# Patient Record
Sex: Male | Born: 1935 | Race: White | Hispanic: No | Marital: Married | Smoking: Former smoker
Health system: Southern US, Community
[De-identification: ages and names within clinical notes are randomized; demographics above are authoritative.]

## PROBLEM LIST (undated history)

## (undated) DIAGNOSIS — I482 Chronic atrial fibrillation, unspecified: Secondary | ICD-10-CM

## (undated) DIAGNOSIS — R7303 Prediabetes: Secondary | ICD-10-CM

## (undated) DIAGNOSIS — E7800 Pure hypercholesterolemia, unspecified: Secondary | ICD-10-CM

## (undated) DIAGNOSIS — H3530 Unspecified macular degeneration: Secondary | ICD-10-CM

## (undated) DIAGNOSIS — K279 Peptic ulcer, site unspecified, unspecified as acute or chronic, without hemorrhage or perforation: Secondary | ICD-10-CM

## (undated) DIAGNOSIS — Z9889 Other specified postprocedural states: Secondary | ICD-10-CM

## (undated) DIAGNOSIS — Z862 Personal history of diseases of the blood and blood-forming organs and certain disorders involving the immune mechanism: Secondary | ICD-10-CM

## (undated) DIAGNOSIS — E785 Hyperlipidemia, unspecified: Secondary | ICD-10-CM

## (undated) DIAGNOSIS — I340 Nonrheumatic mitral (valve) insufficiency: Secondary | ICD-10-CM

## (undated) DIAGNOSIS — Z9289 Personal history of other medical treatment: Secondary | ICD-10-CM

## (undated) HISTORY — DX: Nonrheumatic mitral (valve) insufficiency: I34.0

## (undated) HISTORY — DX: Hyperlipidemia, unspecified: E78.5

## (undated) HISTORY — DX: Personal history of other medical treatment: Z92.89

## (undated) HISTORY — DX: Personal history of diseases of the blood and blood-forming organs and certain disorders involving the immune mechanism: Z86.2

## (undated) HISTORY — PX: HEMORRHOID SURGERY: SHX153

## (undated) HISTORY — DX: Unspecified macular degeneration: H35.30

## (undated) HISTORY — DX: Chronic atrial fibrillation, unspecified: I48.20

## (undated) HISTORY — PX: COLONOSCOPY: SHX174

## (undated) HISTORY — DX: Prediabetes: R73.03

## (undated) HISTORY — DX: Peptic ulcer, site unspecified, unspecified as acute or chronic, without hemorrhage or perforation: K27.9

## (undated) HISTORY — DX: Other specified postprocedural states: Z98.890

## (undated) HISTORY — DX: Pure hypercholesterolemia, unspecified: E78.00

## (undated) HISTORY — PX: CARDIAC ELECTROPHYSIOLOGY STUDY AND ABLATION: SHX1294

---

## 1973-09-13 HISTORY — PX: STOMACH SURGERY: SHX791

## 1975-09-14 HISTORY — PX: LUNG SURGERY: SHX703

## 2007-06-07 ENCOUNTER — Encounter: Admission: RE | Admit: 2007-06-07 | Discharge: 2007-06-07 | Payer: Self-pay | Admitting: Internal Medicine

## 2007-09-18 ENCOUNTER — Encounter: Admission: RE | Admit: 2007-09-18 | Discharge: 2007-09-18 | Payer: Self-pay | Admitting: Internal Medicine

## 2008-08-12 ENCOUNTER — Ambulatory Visit (HOSPITAL_COMMUNITY): Admission: RE | Admit: 2008-08-12 | Discharge: 2008-08-13 | Payer: Self-pay | Admitting: General Surgery

## 2008-08-12 ENCOUNTER — Encounter (INDEPENDENT_AMBULATORY_CARE_PROVIDER_SITE_OTHER): Payer: Self-pay | Admitting: General Surgery

## 2008-12-11 ENCOUNTER — Encounter: Payer: Self-pay | Admitting: Internal Medicine

## 2008-12-25 ENCOUNTER — Encounter: Payer: Self-pay | Admitting: Internal Medicine

## 2009-02-28 ENCOUNTER — Ambulatory Visit (HOSPITAL_COMMUNITY): Admission: RE | Admit: 2009-02-28 | Discharge: 2009-02-28 | Payer: Self-pay | Admitting: Cardiology

## 2009-04-04 ENCOUNTER — Ambulatory Visit (HOSPITAL_COMMUNITY): Admission: RE | Admit: 2009-04-04 | Discharge: 2009-04-04 | Payer: Self-pay | Admitting: Cardiology

## 2009-06-18 ENCOUNTER — Encounter: Payer: Self-pay | Admitting: Internal Medicine

## 2009-07-14 ENCOUNTER — Encounter: Payer: Self-pay | Admitting: Internal Medicine

## 2009-07-21 ENCOUNTER — Ambulatory Visit: Payer: Self-pay | Admitting: Internal Medicine

## 2009-07-21 DIAGNOSIS — Z8679 Personal history of other diseases of the circulatory system: Secondary | ICD-10-CM

## 2009-07-21 DIAGNOSIS — I4892 Unspecified atrial flutter: Secondary | ICD-10-CM

## 2009-07-21 DIAGNOSIS — I517 Cardiomegaly: Secondary | ICD-10-CM

## 2009-07-21 DIAGNOSIS — E782 Mixed hyperlipidemia: Secondary | ICD-10-CM | POA: Insufficient documentation

## 2009-07-29 ENCOUNTER — Ambulatory Visit: Payer: Self-pay | Admitting: Internal Medicine

## 2009-07-29 ENCOUNTER — Ambulatory Visit (HOSPITAL_COMMUNITY): Admission: RE | Admit: 2009-07-29 | Discharge: 2009-07-30 | Payer: Self-pay | Admitting: Internal Medicine

## 2009-09-22 ENCOUNTER — Ambulatory Visit: Payer: Self-pay | Admitting: Internal Medicine

## 2009-10-16 ENCOUNTER — Encounter: Payer: Self-pay | Admitting: Internal Medicine

## 2009-12-03 ENCOUNTER — Telehealth: Payer: Self-pay | Admitting: Internal Medicine

## 2009-12-12 ENCOUNTER — Ambulatory Visit: Payer: Self-pay | Admitting: Internal Medicine

## 2009-12-12 DIAGNOSIS — R42 Dizziness and giddiness: Secondary | ICD-10-CM | POA: Insufficient documentation

## 2009-12-15 ENCOUNTER — Encounter: Payer: Self-pay | Admitting: Internal Medicine

## 2009-12-24 ENCOUNTER — Encounter (INDEPENDENT_AMBULATORY_CARE_PROVIDER_SITE_OTHER): Payer: Self-pay | Admitting: Internal Medicine

## 2009-12-24 ENCOUNTER — Ambulatory Visit (HOSPITAL_COMMUNITY): Admission: RE | Admit: 2009-12-24 | Discharge: 2009-12-24 | Payer: Self-pay | Admitting: Internal Medicine

## 2009-12-24 ENCOUNTER — Ambulatory Visit: Payer: Self-pay | Admitting: Vascular Surgery

## 2010-02-19 ENCOUNTER — Encounter: Payer: Self-pay | Admitting: Internal Medicine

## 2010-04-30 ENCOUNTER — Ambulatory Visit: Payer: Self-pay | Admitting: Cardiology

## 2010-04-30 ENCOUNTER — Encounter: Payer: Self-pay | Admitting: Internal Medicine

## 2010-06-04 ENCOUNTER — Ambulatory Visit: Payer: Self-pay | Admitting: Cardiology

## 2010-06-30 ENCOUNTER — Ambulatory Visit: Payer: Self-pay | Admitting: Cardiology

## 2010-07-31 ENCOUNTER — Ambulatory Visit: Payer: Self-pay | Admitting: Cardiology

## 2010-09-08 ENCOUNTER — Ambulatory Visit: Payer: Self-pay | Admitting: Cardiology

## 2010-10-02 ENCOUNTER — Ambulatory Visit: Payer: Self-pay | Admitting: Cardiology

## 2010-10-11 LAB — CONVERTED CEMR LAB
Basophils Absolute: 0 10*3/uL (ref 0.0–0.1)
CO2: 28 meq/L (ref 19–32)
Calcium: 9 mg/dL (ref 8.4–10.5)
Creatinine, Ser: 1.1 mg/dL (ref 0.4–1.5)
GFR calc non Af Amer: 68.66 mL/min (ref >60)
HCT: 43.5 % (ref 39.0–52.0)
INR: 2 — ABNORMAL HIGH (ref 0.8–1.0)
Lymphocytes Relative: 25.8 % (ref 12.0–46.0)
MCV: 92.9 fL (ref 78.0–100.0)
Monocytes Relative: 5.5 % (ref 3.0–12.0)
Neutro Abs: 4.5 10*3/uL (ref 1.4–7.7)
Neutrophils Relative %: 67.7 % (ref 43.0–77.0)
Potassium: 4.1 meq/L (ref 3.5–5.1)
RBC: 4.69 M/uL (ref 4.22–5.81)
WBC: 6.6 10*3/uL (ref 4.5–10.5)
aPTT: 39 s — ABNORMAL HIGH (ref 21.7–28.8)

## 2010-10-13 NOTE — Letter (Signed)
Summary: [facility] Office Note   [facility] Office Note   Imported By: Roderic Ovens 03/13/2010 11:34:29  _____________________________________________________________________  External Attachment:    Type:   Image     Comment:   External Document

## 2010-10-13 NOTE — Letter (Signed)
Summary: [facility] Office Note  [facility] Office Note   Imported By: Roderic Ovens 12/25/2009 16:26:40  _____________________________________________________________________  External Attachment:    Type:   Image     Comment:   External Document

## 2010-10-13 NOTE — Letter (Signed)
Summary: [facility] Office Note  [facility] Office Note   Imported By: Roderic Ovens 01/01/2010 10:21:53  _____________________________________________________________________  External Attachment:    Type:   Image     Comment:   External Document

## 2010-10-13 NOTE — Assessment & Plan Note (Signed)
Summary: rov/. gd   Visit Type:  Follow-up Referring Provider:  Dr Patty Sermons Primary Provider:  Dr Burton Apley   History of Present Illness: The patient presents today for routine electrophysiology followup. He reports having palpitations on several occasions since his ablation for atrial flutter.  He presented to Dr Yevonne Pax office on 10/16/09 and was documented to have atrial fibrillation, V rates 90s-100.  He has had no further symptoms of atrial flutter.  He reports orthostatic dizziness for several days and reports having frequent loose stools several days ago.  The patient denies symptoms of chest pain, shortness of breath, orthopnea, PND, lower extremity edema, presyncope, syncope, or neurologic sequela. The patient is tolerating medications without difficulties and is otherwise without complaint today.   Current Medications (verified): 1)  Fish Oil   Oil (Fish Oil) .... 3 Once Daily 2)  Omeprazole 20 Mg Cpdr (Omeprazole) .... Once Daily 3)  Warfarin Sodium 5 Mg Tabs (Warfarin Sodium) .... Use As Directed By Anticoagulation Clinic 4)  Zyrtec Allergy 10 Mg Tabs (Cetirizine Hcl) .... Once Daily 5)  Amlodipine Besylate 5 Mg Tabs (Amlodipine Besylate) .... Take One Tablet By Mouth Daily 6)  Doxepin Hcl 50 Mg Caps (Doxepin Hcl) .... At Bedtime 7)  Icaps Lutein-Zeaxanthin  Cr-Tabs (Specialty Vitamins Products) .... 4 Once Daily 8)  Lipitor 10 Mg Tabs (Atorvastatin Calcium) .... Take 1/2 Tablet By Mouth Daily. 9)  Metoprolol Succinate 25 Mg Xr24h-Tab (Metoprolol Succinate) .... Take One-Half Tablet By Mouth Daily  Allergies (verified): No Known Drug Allergies  Past History:  Past Medical History: Reviewed history from 09/22/2009 and no changes required. 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 Pt lives in [place] with wife.  Retired from [employer] ([employer]  Smoked 7-8 years but quit 1977.  ETOH- 2 beers/wk.  Drugs- none  Review of Systems       All systems are reviewed and negative except as listed in the HPI.   Vital Signs:  Patient profile:   77 year old male Height:      74 inches Weight:      207 pounds BMI:     26.67 Pulse rate:   65 / minute BP sitting:   100 / 70  (left arm)  Vitals Entered By: Laurance Flatten CMA (December 12, 2009 9:48 AM)  Physical Exam  General:  Well developed, well nourished, in no acute distress. Head:  normocephalic and atraumatic Eyes:  PERRLA/EOM intact; conjunctiva and lids normal. Mouth:  Teeth, gums and palate normal. Oral mucosa normal. Neck:  Neck supple, no JVD. No masses, thyromegaly or abnormal cervical nodes. Lungs:  Clear bilaterally to auscultation and percussion. Heart:  Non-displaced PMI, chest non-tender; regular rate and rhythm, S1, S2 without murmurs, rubs or gallops. Carotid upstroke normal, no bruit. Normal abdominal aortic size, no bruits. Femorals normal pulses, no bruits. Pedals normal pulses. No edema, no varicosities. Abdomen:  Bowel sounds positive; abdomen soft and non-tender without masses, organomegaly, or hernias noted. No hepatosplenomegaly. Msk:  Back normal, normal gait. Muscle strength and tone normal. Pulses:  pulses normal in all 4 extremities Extremities:  No clubbing or  cyanosis. Neurologic:  Alert and oriented x 3.  CNII-XII intact, strength/sensation are intact Skin:  Intact without lesions or rashes. Psych:  Normal affect.   EKG  Procedure date:  12/12/2009  Findings:      sinus rhythm 60 bpm, LAD  Impression &  Recommendations:  Problem # 1:  ATRIAL FIBRILLATION, HX OF (ICD-V12.59) No further episodes of atrial flutter.  He has episodic atrial fibrillation, with minimal symptoms.  His V rates appear stable during afib. He had a long HV interval during his EP study.  I would therefore prefer to not increase metoprolol at this time. I would avoid AAD unless his symptoms of afib worsen. Continue coumadin longterm for stroke prevention.  Problem # 2:  POSTURAL LIGHTHEADEDNESS (ICD-780.4) The patient's blood pressure is soft today and worsens with standing.  He has orthostatic dizziness. I have recommended aggressive hydration.  He will hold amlodipine and follow his BP carefully at home. He has scheduled follow-up with Dr Patty Sermons next week.  Problem # 3:  ESSENTIAL HYPERTENSION, BENIGN (ICD-401.1) stop amlodipine and follow BP  as above  Problem # 4:  ATRIAL FLUTTER (ICD-427.32) successful CTI ablation. continue coumadin for afib.  Patient Instructions: 1)  Your physician recommends that you schedule a follow-up appointment as needed with DR Ontario Pettengill 2)  Keep hydrated 3)  Your physician has recommended you make the following change in your medication: stop Amlodipine 4)  Keep your follow up with Dr Patty Sermons  Appended Document: [facility]     Allergies: No Known Drug Allergies  Vital Signs:  Patient profile:   77 year old male BP standing:   88 / 60  Serial Vital Signs/Assessments:  Time      Position  BP       Pulse  Resp  Temp     By           Lying LA  102/70                         Laurance Flatten CMA           Sitting   98/60                          Laurance Flatten CMA           Standing  88/60                          Laurance Flatten CMA

## 2010-10-13 NOTE — Letter (Signed)
Summary: [facility] Progress Note  [facility] Progress Note   Imported By: Roderic Ovens 06/12/2010 15:57:36  _____________________________________________________________________  External Attachment:    Type:   Image     Comment:   External Document

## 2010-10-13 NOTE — Assessment & Plan Note (Signed)
Summary: appt time 1:45/EPH/JML   Visit Type:  Follow-up Referring Provider:  Dr Patty Sermons Primary Provider:  Dr Burton Apley   History of Present Illness: The patient presents today for routine electrophysiology followup. He reports doing very well since his atrial flutter ablation. The patient denies symptoms of palpitations, chest pain, shortness of breath, orthopnea, PND, lower extremity edema, dizziness, presyncope, syncope, or neurologic sequela. The patient is tolerating medications without difficulties and is otherwise without complaint today.   Current Medications (verified): 1)  Fish Oil   Oil (Fish Oil) .... 3 Once Daily 2)  Omeprazole 20 Mg Cpdr (Omeprazole) .... Once Daily 3)  Warfarin Sodium 5 Mg Tabs (Warfarin Sodium) .... Use As Directed By Anticoagulation Clinic 4)  Zyrtec Allergy 10 Mg Tabs (Cetirizine Hcl) .... Once Daily 5)  Amlodipine Besylate 5 Mg Tabs (Amlodipine Besylate) .... Take One Tablet By Mouth Daily 6)  Doxepin Hcl 50 Mg Caps (Doxepin Hcl) .... At Bedtime 7)  Icaps Lutein-Zeaxanthin  Cr-Tabs (Specialty Vitamins Products) .... 4 Once Daily 8)  Lipitor 10 Mg Tabs (Atorvastatin Calcium) .... Take 1/2 Tablet By Mouth Daily.  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 Pt lives in [place] with wife.  Retired from [employer] ([employer]).  Smoked 7-8 years but quit 1977.  ETOH- 2 beers/wk.  Drugs- none  Vital Signs:  Patient profile:   77 year old male Height:      74 inches Weight:       213 pounds BMI:     27.45 BP sitting:   148 / 80  (left arm)  Vitals Entered By: Laurance Flatten CMA (September 22, 2009 1:43 PM)  Physical Exam  General:  Well developed, well nourished, in no acute distress. Head:  normocephalic and atraumatic Eyes:  PERRLA/EOM intact; conjunctiva and lids normal. Nose:  no deformity, discharge, inflammation, or lesions Mouth:  Teeth, gums and palate normal. Oral mucosa normal. Neck:  Neck supple, no JVD. No masses, thyromegaly or abnormal cervical nodes. Lungs:  Clear bilaterally to auscultation and percussion. Heart:  Non-displaced PMI, chest non-tender; regular rate and rhythm, S1, S2 without murmurs, rubs or gallops. Carotid upstroke normal, no bruit. Normal abdominal aortic size, no bruits. Femorals normal pulses, no bruits. Pedals normal pulses. No edema, no varicosities. Abdomen:  Bowel sounds positive; abdomen soft and non-tender without masses, organomegaly, or hernias noted. No hepatosplenomegaly. Msk:  Back normal, normal gait. Muscle strength and tone normal. Pulses:  pulses normal in all 4 extremities Extremities:  No clubbing or cyanosis. Neurologic:  Alert and oriented x 3.  CNII-XII intact, strength/sensation are intact Skin:  Intact without lesions or rashes.   EKG  Procedure date:  09/22/2009  Findings:      sinus rhythm 65, PR 210, nonspecific ST/T changes  Impression & Recommendations:  Problem # 1:  ATRIAL FLUTTER (ICD-427.32) Doing well s/p CTI ablation.   metoprolol stopped due to bradycardia.  The following medications were removed from the medication list:    Metoprolol Succinate 25 Mg Xr24h-tab (Metoprolol succinate) .[name]... Take one tablet by mouth daily His updated medication list for this problem  includes:    Warfarin Sodium 5 Mg Tabs (Warfarin sodium) ..... Use as directed by anticoagulation clinic  Orders: EKG w/ Interpretation (93000)  Problem # 2:  ATRIAL FIBRILLATION, HX OF (ICD-V12.59) The patient has a  h/o afib, though most of his prior arrhythmia burden was for atrial flutter. At this point, I would continue coumadin. If he develops symptomatic afib, then I would recommend an antiarrhythmic strategy at that time.  The following medications were removed from the medication list:    Metoprolol Succinate 25 Mg Xr24h-tab (Metoprolol succinate) .[name]... Take one tablet by mouth daily His updated medication list for this problem includes:    Warfarin Sodium 5 Mg Tabs (Warfarin sodium) ..... Use as directed by anticoagulation clinic    Amlodipine Besylate 5 Mg Tabs (Amlodipine besylate) .[name]... Take one tablet by mouth daily  Problem # 3:  ESSENTIAL HYPERTENSION, BENIGN (ICD-401.1) stable, no changes today  The following medications were removed from the medication list:    Metoprolol Succinate 25 Mg Xr24h-tab (Metoprolol succinate) .[name]... Take one tablet by mouth daily His updated medication list for this problem includes:    Amlodipine Besylate 5 Mg Tabs (Amlodipine besylate) .[name]... Take one tablet by mouth daily  Patient Instructions: 1)  Your physician recommends that you schedule a follow-up appointment as needed 2)  Continue regular follow-up with Dr Patty Sermons

## 2010-10-13 NOTE — Progress Notes (Signed)
Summary: SOB A-FIB  Phone Note Call from Patient Call back at Home Phone [phone]   Caller: Spouse/NANCY Summary of Call: PT HAVING SOB AND A-FIB Initial call taken by: Judie Grieve,  December 03, 2009 10:05 AM  Follow-up for Phone Call        Dr Connye Burkitt started him back on Toprol XL 12.5mg  daily.  When he goes out of rhythm it slow and he becomes SOB.  This is occurring  four times a week.  Started on 10/16/09 and he has a f/u with Dr Connye Burkitt on 12/15/09 Follow-up by: Hillis Range, MD,  December 03, 2009 4:51 PM  Additional Follow-up for Phone Call Additional follow up Details #1::        We should try to obtain records from Dr Patty Sermons to see what this arrhythmia may be.  I would like to have Mr Raymond follow-up with me also. Additional Follow-up by: Hillis Range, MD,  December 03, 2009 4:52 PM

## 2010-10-13 NOTE — Assessment & Plan Note (Signed)
Summary: nep/aflutter/eval for ablation/jml   Visit Type:  Initial Consult Referring Provider:  Dr Patty Sermons Primary Provider:  Dr Burton Apley  CC:  atrial flutter.  History of Present Illness: [name] is a pleasant 77 yo WM with a h/o atrial flutter and HTN, who presents today for EP consultation regarding atrial flutter.  He reports initially being diagnosed with atrial fibrillation 4/10 after presenting for a routine physical exam.  He was placed on coumadin and toprol XL and subsequently cardioverted 02/28/09 from atrial flutter.  He reports returning to atrial flutter within several days.  He reports symptoms of progressive fatigue, SOB, with decreased exercise capacity.  He was placed on amiodarone and again cardioverted 04/04/09.  He reports significant improvement in exercise tolerance.  Unfortunately, he returned to atrial flutter within 4-5 days.  He then developed recurrent symptoms of fatigue.  He now presents for further EP consultation. He denies chest pain, stroke like symptoms, orthopnea, PND, edema, presyncope, or syncope.  Current Medications (verified): 1)  Fish Oil   Oil (Fish Oil) .... 3 Once Daily 2)  Omeprazole 20 Mg Cpdr (Omeprazole) .... Once Daily 3)  Warfarin Sodium 5 Mg Tabs (Warfarin Sodium) .... Use As Directed By Anticoagulation Clinic 4)  Metoprolol Succinate 25 Mg Xr24h-Tab (Metoprolol Succinate) .... Take One Tablet By Mouth Daily 5)  Zyrtec Allergy 10 Mg Tabs (Cetirizine Hcl) .... Once Daily 6)  Amlodipine Besylate 5 Mg Tabs (Amlodipine Besylate) .... Take One Tablet By Mouth Daily 7)  Doxepin Hcl 50 Mg Caps (Doxepin Hcl) .... At Bedtime 8)  Icaps Lutein-Zeaxanthin  Cr-Tabs (Specialty Vitamins Products) .... 4 Once Daily 9)  Lipitor 10 Mg Tabs (Atorvastatin Calcium) .... Take 1/2 Tablet By Mouth Daily.  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  Pts sister had atrial flutter and is s/p CTI ablation.  Father is alive at age 55 but has dementia.  Social History: Pt lives in [place] with wife.  Retired from [employer] ([employer]).  Smoked 7-8 years but quit 1977.  ETOH- 2 beers/wk.  Drugs- none  Review of Systems       All systems are reviewed and negative except as listed in the HPI. In addition, he snores but does not have symptoms of apnea.  He has difficulty hearing.  Vital Signs:  Patient profile:   77 year old male Height:      74 inches Weight:      209 pounds BMI:     26.93 Pulse rate:   84 / minute BP sitting:   132 / 74  (left arm) Cuff size:   regular  Vitals Entered By: Hardin Negus, RMA (July 21, 2009 10:01 AM)  Physical Exam  General:  Well developed, well nourished, in no acute distress. Head:  normocephalic and atraumatic Eyes:  PERRLA/EOM intact; conjunctiva and lids normal. Nose:  no deformity, discharge, inflammation, or lesions Mouth:  Teeth, gums and palate normal. Oral mucosa normal. Neck:  Neck supple, no JVD. No masses, thyromegaly or abnormal cervical nodes. Lungs:  Clear bilaterally to auscultation and percussion. Heart:  iRRR, no m/r/g Abdomen:  Bowel sounds positive; abdomen soft and  non-tender without masses, organomegaly, or hernias noted. No hepatosplenomegaly. Msk:  Back normal, normal gait. Muscle strength and tone normal. Pulses:  pulses normal in all 4 extremities Extremities:  No clubbing or cyanosis. Neurologic:  Alert and oriented x 3. Skin:  Intact without lesions or rashes. Cervical Nodes:  no significant adenopathy Psych:  Normal affect.   EKG  Procedure date:   07/21/2009  Findings:      typical appearing atrial flutter, V rate 80s  Impression & Recommendations:  Problem # 1:  ATRIAL FLUTTER (ICD-427.32)  Though the patient carries a diagnosis of both atrial fibrillation and atrial flutter, all EKGs that I have reviewed from Dr Yevonne Pax office reveal typical appearing atrial flutter.  He is clearly symptomatic with atrial flutter and has failed medical therapy with amiodarone and metoprolol.  He is appropriately anticoagulated with coumadin.  Therapeutic strategies for atrial flutter including medicine and ablation were discussed in detail with the patient today. Risk, benefits, and alternatives to EP study and radiofrequency ablation for atrial flutter were also discussed in detail today. These risks include but are not limited to stroke, bleeding, vascular damage, tamponade, perforation, damage to the heart requiring pacemaker, and death. The patient understands these risk and wishes to proceed.  We will therefore plan for carto guided ablation of atrial flutter.  Orders: Ablation (Ablation)  Problem # 2:  ESSENTIAL HYPERTENSION, BENIGN (ICD-401.1)  Stable,  no changes today  His updated medication list for this problem includes:    Metoprolol Succinate 25 Mg Xr24h-tab (Metoprolol succinate) .[name]... Take one tablet by mouth daily    Amlodipine Besylate 5 Mg Tabs (Amlodipine besylate) .[name]... Take one tablet by mouth daily  Problem # 3:  MIXED HYPERLIPIDEMIA (ICD-272.2)  stable His updated medication list for this problem includes:    Lipitor 10 Mg Tabs (Atorvastatin calcium) .[name]... Take 1/2 tablet by mouth daily.  His updated medication list for this problem includes:    Lipitor 10 Mg Tabs (Atorvastatin calcium) .[name]... Take 1/2 tablet by mouth daily.  Other Orders: TLB-BMP (Basic Metabolic Panel-BMET) (80048-METABOL) TLB-CBC Platelet - w/Differential (85025-CBCD) TLB-PT (Protime) (85610-PTP) TLB-PTT (85730-PTTL)  Patient  Instructions: 1)  You have been diagnosed with atrial flutter. Atrial flutter is a condition in which one of the upper chambers of the heart has extra electrical cells causing it to beat very fast. Please see the handout/brochure given to you today for further information. 2)  Your physician has recommended that you have an ablation.  Catheter ablation is a medical procedure used to treat some cardiac arrhythmias (irregular heartbeats). During catheter ablation, a long, thin, flexible tube is put into a blood vessel in your groin (upper thigh), or neck. This tube is called an ablation catheter. It is then guided to your heart through the blood vessel. Radiofrequency waves destroy small areas of heart tissue where abnormal heartbeats may cause an arrhythmia to start.  Please see the instruction sheet given to you today.

## 2010-10-29 ENCOUNTER — Other Ambulatory Visit (INDEPENDENT_AMBULATORY_CARE_PROVIDER_SITE_OTHER): Payer: Medicare Other

## 2010-10-29 DIAGNOSIS — Z7901 Long term (current) use of anticoagulants: Secondary | ICD-10-CM

## 2010-10-29 DIAGNOSIS — I4891 Unspecified atrial fibrillation: Secondary | ICD-10-CM

## 2010-11-10 ENCOUNTER — Ambulatory Visit (INDEPENDENT_AMBULATORY_CARE_PROVIDER_SITE_OTHER): Payer: Medicare Other | Admitting: Cardiology

## 2010-11-10 DIAGNOSIS — E7800 Pure hypercholesterolemia, unspecified: Secondary | ICD-10-CM

## 2010-11-10 DIAGNOSIS — Z7901 Long term (current) use of anticoagulants: Secondary | ICD-10-CM

## 2010-11-10 DIAGNOSIS — I4891 Unspecified atrial fibrillation: Secondary | ICD-10-CM

## 2010-11-10 DIAGNOSIS — Z79899 Other long term (current) drug therapy: Secondary | ICD-10-CM

## 2010-11-11 ENCOUNTER — Ambulatory Visit: Payer: Self-pay | Admitting: Cardiology

## 2010-12-09 ENCOUNTER — Ambulatory Visit (INDEPENDENT_AMBULATORY_CARE_PROVIDER_SITE_OTHER): Payer: Medicare Other | Admitting: *Deleted

## 2010-12-09 DIAGNOSIS — Z7901 Long term (current) use of anticoagulants: Secondary | ICD-10-CM

## 2010-12-09 DIAGNOSIS — I4891 Unspecified atrial fibrillation: Secondary | ICD-10-CM

## 2010-12-09 DIAGNOSIS — I4821 Permanent atrial fibrillation: Secondary | ICD-10-CM | POA: Insufficient documentation

## 2010-12-09 LAB — POCT INR: INR: 2.7

## 2010-12-16 LAB — PROTIME-INR
INR: 2.15 — ABNORMAL HIGH (ref 0.00–1.49)
INR: 2.16 — ABNORMAL HIGH (ref 0.00–1.49)

## 2010-12-16 LAB — APTT: aPTT: 44 seconds — ABNORMAL HIGH (ref 24–37)

## 2010-12-23 ENCOUNTER — Telehealth: Payer: Self-pay | Admitting: Cardiology

## 2010-12-23 NOTE — Telephone Encounter (Signed)
Pt.said that his general pract.,Dr. Su Hilt, said that his heart was beating too fast and he went ahead and changed his "heart med".  Pt.wanted to just let Dr.Brackbill know.  He wants to speak with nurse.

## 2010-12-23 NOTE — Telephone Encounter (Signed)
Dr. Su Hilt stated yesterday that his heart rate was going and he suggested increasing Toprol XL to 25mg  daily;  FYI

## 2010-12-23 NOTE — Telephone Encounter (Signed)
Okay to try the higher dose of Toprol as per Dr. Su Hilt.

## 2011-01-01 ENCOUNTER — Encounter: Payer: Self-pay | Admitting: *Deleted

## 2011-01-01 DIAGNOSIS — E7800 Pure hypercholesterolemia, unspecified: Secondary | ICD-10-CM | POA: Insufficient documentation

## 2011-01-06 ENCOUNTER — Ambulatory Visit (INDEPENDENT_AMBULATORY_CARE_PROVIDER_SITE_OTHER): Payer: Medicare Other | Admitting: *Deleted

## 2011-01-06 ENCOUNTER — Encounter: Payer: Self-pay | Admitting: Cardiology

## 2011-01-06 ENCOUNTER — Ambulatory Visit (INDEPENDENT_AMBULATORY_CARE_PROVIDER_SITE_OTHER): Payer: Medicare Other | Admitting: Cardiology

## 2011-01-06 VITALS — BP 122/78 | HR 116 | Wt 213.0 lb

## 2011-01-06 DIAGNOSIS — Z7901 Long term (current) use of anticoagulants: Secondary | ICD-10-CM

## 2011-01-06 DIAGNOSIS — I4891 Unspecified atrial fibrillation: Secondary | ICD-10-CM

## 2011-01-06 DIAGNOSIS — IMO0002 Reserved for concepts with insufficient information to code with codable children: Secondary | ICD-10-CM

## 2011-01-06 LAB — POCT INR: INR: 3.5

## 2011-01-06 NOTE — Assessment & Plan Note (Signed)
The patient has been remaining in atrial fibrillation.  He has recent annual checkup with Dr. Zenaida Deed and his heart rate was rapid and irregular in Dr. Su Hilt increased his beta blocker with improvement.  The patient complains of exertional dyspnea.  He has not been expressing any chest pain or angina.  He's not having a problem from the Coumadin and his protimes have generally been good although today his INR is surprisingly high at 3.5 even though he is on no new medication.

## 2011-01-06 NOTE — Progress Notes (Signed)
Mark Rivas Date of Birth:  28-Apr-1928 [facility] Cardiology / [facility] [address]   [address] [place], [place]  [address] [phone]           Fax   [phone]  HPI: This 77 year old married Caucasian gentleman is seen for a scheduled 3 month followup office visit and EKG.  He has a history of recurrent atrial fibrillation.  He was cardioverted on 02/28/09 that lasted only 5 days before going back in atrial fibrillation.  He was cardioverted the second time on 04/04/09 but when we saw him in August 2010 he was again back in atrial flutter.  He saw Dr. Johney Frame who performed atrial flutter ablation on 07/29/09.  He has done well with the ablation but has had some recurrent atrial fibrillation since then.  His last EKG prior to today showed atrial fibrillation on 07/31/10.  He has not had any clinical problems with her Coumadin and is not have any TIA symptoms.  Current Outpatient Prescriptions  Medication Sig Dispense Refill  . doxepin (SINEQUAN) 50 MG capsule Take 50 mg by mouth daily.        [name] loratadine (CLARITIN) 10 MG tablet Take 10 mg by mouth daily.        . metoprolol succinate (TOPROL-XL) 25 MG 24 hr tablet Take 25 mg by mouth daily.       . Multiple Vitamins-Minerals (ICAPS MV PO) Take by mouth. Takes four daily       . Omega-3 Fatty Acids (FISH OIL) 1000 MG CAPS Take by mouth 2 (two) times daily.       [name] omeprazole (PRILOSEC) 20 MG capsule Take 20 mg by mouth daily.        . simvastatin (ZOCOR) 20 MG tablet Take 20 mg by mouth at bedtime.        [name] warfarin (COUMADIN) 5 MG tablet Take 5 mg by mouth daily. As directed       . cetirizine (ZYRTEC) 10 MG tablet Take 10 mg by mouth daily. Taking as needed       . DISCONTD: amiodarone (PACERONE) 200 MG tablet Take 200 mg by mouth daily.        [name] DISCONTD: amLODipine (NORVASC) 5 MG tablet Take 5 mg by mouth daily.        [name] DISCONTD: ferrous sulfate 325 (65 FE) MG tablet Take 325 mg by mouth daily with breakfast. Taking every  other day         No Known Allergies  Patient Active Problem List  Diagnoses  . MIXED HYPERLIPIDEMIA  . ESSENTIAL HYPERTENSION, BENIGN  . ATRIAL FLUTTER  . LEFT ATRIAL ENLARGEMENT  . POSTURAL LIGHTHEADEDNESS  . ATRIAL FIBRILLATION, HX OF  . Atrial fibrillation  . Paroxysmal atrial fibrillation  . Hypercholesterolemia    History  Smoking status  . Former Smoker  Smokeless tobacco  . Not on file    History  Alcohol Use     No family history on file.  Review of Systems: The patient denies any heat or cold intolerance.  No weight gain or weight loss.  The patient denies headaches or blurry vision.  There is no cough or sputum production.  The patient denies dizziness.  There is no hematuria or hematochezia.  The patient denies any muscle aches or arthritis.  The patient denies any rash.  The patient denies frequent falling or instability.  There is no history of depression or anxiety.  All other systems were reviewed and are negative.  Physical Exam: Filed Vitals:   01/06/11 0938  BP: 122/78  Pulse: 116  This is a well-developed well-nourished elderly gentleman in no acute distress.The chest is clear to percussion and auscultation. There are no rales or rhonchi. Expansion of the chest is symmetrical.Pupils equal and reactive.   Extraocular Movements are full.  There is no scleral icterus.  The mouth and pharynx are normal.  The neck is supple.  The carotids reveal no bruits.  The jugular venous pressure is normal.  The thyroid is not enlarged.  There is no lymphadenopathy.The precordium is quiet.  The first heart sound is normal.  The second heart sound is physiologically split.  There is no murmur gallop rub or click.  There is no abnormal lift or heave.  The rhythm is irregular.The abdomen is soft and nontender. Bowel sounds are normal. The liver and spleen are not enlarged. There Are no abdominal masses. There are no bruits.  Normal extremity without phlebitis or edema.  Pedal  pulses are present.Strength is normal and symmetrical in all extremities.  There is no lateralizing weakness.  There are no sensory deficits.  His EKG today shows atrial fibrillation with a ventricular response of 116.  There are no ischemic changes   Assessment / Plan: Continue present medication.Continue present dose of metoprolol.  Recheck for monthly protimes will see him in 3 months for followup office visit.  He will try to lose weight and this may help his exertional dyspnea and fatigue.

## 2011-01-11 ENCOUNTER — Encounter: Payer: Medicare Other | Admitting: *Deleted

## 2011-01-26 NOTE — Op Note (Signed)
NAMEGARIK, DIAMANT                  ACCOUNT NO.:  [id number]   MEDICAL RECORD NO.:  [id number]          PATIENT TYPE:  OIB   LOCATION:  2899                         FACILITY:  [facility]   PHYSICIAN:  Cassell Clement, M.D. DATE OF BIRTH:  1927/11/25   DATE OF PROCEDURE:  02/28/2009  DATE OF DISCHARGE:  02/28/2009                               OPERATIVE REPORT   OPERATION:  Electrical cardioversion.   HISTORY:  This 77 year old gentleman has atrial flutter/fibrillation.  He has failed to convert on an outpatient regimen.  He has been  adequately anticoagulated for more than a month with Coumadin.  He now  comes to short-stay for electrical cardioversion.   After suitable intravenous anesthesia of 170 mg of propofol, the patient  was given a single shock using the AP pads of 120 joules.  He converted  to normal sinus rhythm.  There were no immediate postanesthetic  complications.   The patient will be continued on his present home medications and he  will be rechecked in the office in 5 days for a followup office visit  and prothrombin time.           ______________________________  Cassell Clement, M.D.     TB/MEDQ  D:  02/28/2009  T:  03/01/2009  Job:  [id number]   cc:   Antony Madura, M.D.  James L. Malon Kindle., M.D.  Sheldon Silvan, M.D.

## 2011-01-26 NOTE — Op Note (Signed)
Mark Rivas, Mark Rivas                  ACCOUNT NO.:  [id number]   MEDICAL RECORD NO.:  [id number]          PATIENT TYPE:  AMB   LOCATION:  DAY                          FACILITY:  [facility]   PHYSICIAN:  Anselm Pancoast. Weatherly, M.D.DATE OF BIRTH:  02-22-1928   DATE OF PROCEDURE:  08/12/2008  DATE OF DISCHARGE:                               OPERATIVE REPORT   PREOPERATIVE DIAGNOSES:  Internal-external hemorrhoids x3 quadrants.   OPERATION:  Internal, external hemorrhoidectomy, general anesthesia in  lithotomy position.   SURGEON:  Dr. Chrissie Noa weatherly.   HISTORY:  Mark Rivas is a 77 year old male who was referred to me  through the courtesy of Dr. Zenaida Deed and Dr. Vilinda Boehringer for  symptomatic bleeding and prolapsing hemorrhoids.  The patient he is  retired, but he is helping his son.  Basically, he is still working.  He  is not any type of cardiac or blood thinners and Dr. Randa Evens has done a  colonoscopy with the findings of prominently prolapsed and bleeding  hemorrhoids and referred to me and I saw him the office approximately 3  weeks ago.  On all three quadrants, he has got large hemorrhoids that  prolapse quite easily.  They bleed and I recommended that we do a formal  internal and external hemorrhoidectomy and he is in agreement with the  procedure.  He is on blood pressure medications and he is on H2 blocker  for history of acid reflux.  He has had a previous partial pneumonectomy  on the right and he has had some intestinal blockage in the past, having  had ulcer surgery years ago.  The patient preoperatively had done a prep  with MAC citrate and was taken to the OR suite.  We gave him 3 grams of  Unasyn.  He has PAS stockings on.  After introduction of general  anesthesia endotracheal tube, I put him up in the yellow fin stirrups.  In this position, and the hemorrhoid on the left is the largest and it  is prolapsed chronically and I then prepped his external and internal  with  Betadine surgical scrub and then solution and draped him in a  sterile manner.  I used about 20 mL of Marcaine with adrenaline to  anesthetize the sphincter area and then kind of dilated him gently to  four fingers would go in easily.  I elected to do the anterior slightly  to the right hemorrhoid first, elevating the external portion off the  sphincter and then controlling of the little bleeders superiorly and  inferior laterally with 3-0 chromic sutures and after the hemorrhoid was  elevated off the sphincter area under clamped with a Bowie, removed the  hemorrhoid and then I used a couple sutures of 2-0 Vicryl, kind of U  stitches or under the clamps, tied these and then used a 3-0 chromic  starting at the pedicle, suturing over the clamp, the distal end,  removing the clamp and then closing the hemorrhoid incision.  The  external area protrudes probably about inch from the dentate line.  Next, the right posterior  lateral was done.  He has a large hemorrhoid  that is kind of right at the midline but I elected to thinking I would  get that with the left side did the similar operation.  This was about  the same size hemorrhoid, elevating it off the sphincter, underclamping  with a Bowie clamp and a couple of sutures of 2-0 Vicryl and then the  apex suture of 2-0 chromic, put a few throws over the tip of the clamp,  removing the Bowie clamp and then closed the internal and external  portion.  The last little stitches I elected to kind of T it just a  little bit so it does not cause anal stenosis area and the 2-0 chromic  suture was tied to itself.  Next the largest hemorrhoid on the left was  then continued in similar manner, elevating it off the sphincter  underclamping with the Bowie, removing it and then with 2-0 Vicryls and  then a running 2-0 chromic and the external portion, I thought where I  could go ahead and close but this little hemorrhoid right in the  posterior after I  basically closed the area.  I elected to just go ahead  and excise that and then pulled some additional 2-0 chromic sutures to  remove this hemorrhoid that was right in the back.  It is a little cyst  hemorrhoid and am sure it would prolapse and be symptomatic if it was  left.  At completion and you could admit a regular anoscope easily and  all suture lines were inspected, appears to have good hemostasis and  then I used some hemorrhoidal cream actually within the anal canal,  opened up a 4x4 and packed about a third of it in the anal canal to hold  the hemorrhoid cream  in the rectal vault and then 4x4s and stretch panties.  The patient is  89 and we will keep him overnight.  If he is having problems voiding, I  will put in a Foley.  If he is able to void without problems, hopefully  he will not need a Foley and will be discharged in the morning.           ______________________________  Anselm Pancoast. Zachery Dakins, M.D.     WJW/MEDQ  D:  08/12/2008  T:  08/12/2008  Job:  [id number]   cc:   Antony Madura, M.D.  Fax: [phone]   Llana Aliment. Malon Kindle., M.D.  Fax: [phone]

## 2011-01-26 NOTE — Op Note (Signed)
NAMECARLOSDANIEL, GROB                  ACCOUNT NO.:  [id number]   MEDICAL RECORD NO.:  [id number]          PATIENT TYPE:  OIB   LOCATION:  2899                         FACILITY:  [facility]   PHYSICIAN:  Cassell Clement, M.D. DATE OF BIRTH:  02-25-1928   DATE OF PROCEDURE:  04/04/2009  DATE OF DISCHARGE:  04/04/2009                               OPERATIVE REPORT   OPERATION:  Direct current cardioversion.   INDICATIONS:  This 77 year old gentleman was brought into [facility] for elective cardioversion.  He had failed to convert on  outpatient therapy.  He has been adequately anticoagulated with  Coumadin.   DESCRIPTION OF PROCEDURE:  The patient was given propofol 100 mg IV by  anesthesiologist Dr. Ramon Dredge following which he was given a single 75  joules shock using the biphasic cardioverter-defibrillator.  The  patient's rhythm converted promptly to sinus bradycardia.  There were no  postanesthetic complications.   IMPRESSION:  Successful electrical cardioversion.           ______________________________  Cassell Clement, M.D.     TB/MEDQ  D:  04/04/2009  T:  04/05/2009  Job:  [id number]   cc:   Judie Petit, M.D.  Antony Madura, M.D.  James L. Malon Kindle., M.D.

## 2011-01-27 ENCOUNTER — Ambulatory Visit (INDEPENDENT_AMBULATORY_CARE_PROVIDER_SITE_OTHER): Payer: Medicare Other | Admitting: *Deleted

## 2011-01-27 ENCOUNTER — Encounter: Payer: Medicare Other | Admitting: *Deleted

## 2011-01-27 DIAGNOSIS — I4891 Unspecified atrial fibrillation: Secondary | ICD-10-CM

## 2011-01-27 DIAGNOSIS — Z7901 Long term (current) use of anticoagulants: Secondary | ICD-10-CM

## 2011-02-09 ENCOUNTER — Ambulatory Visit: Payer: Medicare Other | Admitting: Cardiology

## 2011-02-24 ENCOUNTER — Ambulatory Visit (INDEPENDENT_AMBULATORY_CARE_PROVIDER_SITE_OTHER): Payer: Medicare Other | Admitting: *Deleted

## 2011-02-24 DIAGNOSIS — I4891 Unspecified atrial fibrillation: Secondary | ICD-10-CM

## 2011-02-24 DIAGNOSIS — Z7901 Long term (current) use of anticoagulants: Secondary | ICD-10-CM

## 2011-03-31 ENCOUNTER — Encounter: Payer: Medicare Other | Admitting: *Deleted

## 2011-03-31 ENCOUNTER — Ambulatory Visit (INDEPENDENT_AMBULATORY_CARE_PROVIDER_SITE_OTHER): Payer: Medicare Other | Admitting: Cardiology

## 2011-03-31 ENCOUNTER — Encounter: Payer: Self-pay | Admitting: Cardiology

## 2011-03-31 ENCOUNTER — Ambulatory Visit (INDEPENDENT_AMBULATORY_CARE_PROVIDER_SITE_OTHER): Payer: Medicare Other | Admitting: *Deleted

## 2011-03-31 VITALS — BP 120/80 | HR 84 | Wt 216.0 lb

## 2011-03-31 DIAGNOSIS — E7800 Pure hypercholesterolemia, unspecified: Secondary | ICD-10-CM

## 2011-03-31 DIAGNOSIS — I119 Hypertensive heart disease without heart failure: Secondary | ICD-10-CM

## 2011-03-31 DIAGNOSIS — I4891 Unspecified atrial fibrillation: Secondary | ICD-10-CM

## 2011-03-31 DIAGNOSIS — Z7901 Long term (current) use of anticoagulants: Secondary | ICD-10-CM

## 2011-03-31 DIAGNOSIS — Z8679 Personal history of other diseases of the circulatory system: Secondary | ICD-10-CM

## 2011-03-31 NOTE — Progress Notes (Signed)
Mark Rivas Date of Birth:  1927-10-28 [facility] Cardiology / [facility] [address]   [address] [place], [place]  [address] [phone]           Fax   [phone]  HPI: This pleasant 77 year old gentleman is seen for a scheduled followup office visit.  He has a history of atrial fibrillation.  In June of 2010 he underwent cardioversion to sinus rhythm but after only 5 days was back in atrial fib.  He was cardioverted the second time on 04/04/09 but a month later was again back in atrial flutter.  He essentially was seen by Dr. Johney Frame who performed atrial flutter ablation on July 29, 2009.  The patient had radiofrequency catheter ablation of a atypical counterclockwise right atrial flutter and he did well post ablation.  However socially he did go back into atrial fib and because of lack of significant symptoms or awareness of his pulse we have left him in atrial fib on Coumadin.  The patient had an echocardiogram on 12/25/08 which showed normal left ventricular systolic function with an ejection fraction of 55-60% and normal diastolic function and mild to moderate left atrial enlargement.  There were no significant valve abnormalities and there was mild tricuspid regurgitation with mild   Pulmonary hypertension and a right ventricular systolic pressure of 44.  The patient has not had ischemic testing.  Current Outpatient Prescriptions  Medication Sig Dispense Refill  . cetirizine (ZYRTEC) 10 MG tablet Take 10 mg by mouth daily. Taking as needed       . doxepin (SINEQUAN) 50 MG capsule Take 50 mg by mouth daily.        [name] loratadine (CLARITIN) 10 MG tablet Take 10 mg by mouth daily.        . metoprolol succinate (TOPROL-XL) 25 MG 24 hr tablet Take 25 mg by mouth daily.       . Multiple Vitamins-Minerals (ICAPS MV PO) Take by mouth. Takes four daily       . Omega-3 Fatty Acids (FISH OIL) 1000 MG CAPS Take by mouth 2 (two) times daily.       [name] omeprazole (PRILOSEC) 20 MG capsule Take  20 mg by mouth daily.        . simvastatin (ZOCOR) 20 MG tablet Take 20 mg by mouth at bedtime.        [name] warfarin (COUMADIN) 5 MG tablet Take 5 mg by mouth daily. As directed         No Known Allergies  Patient Active Problem List  Diagnoses  . MIXED HYPERLIPIDEMIA  . ESSENTIAL HYPERTENSION, BENIGN  . ATRIAL FLUTTER  . LEFT ATRIAL ENLARGEMENT  . POSTURAL LIGHTHEADEDNESS  . ATRIAL FIBRILLATION, HX OF  . Atrial fibrillation  . Paroxysmal atrial fibrillation  . Hypercholesterolemia  . Benign hypertensive heart disease without heart failure    History  Smoking status  . Former Smoker  Smokeless tobacco  . Not on file    History  Alcohol Use     No family history on file.  Review of Systems: The patient denies any heat or cold intolerance.  No weight gain or weight loss.  The patient denies headaches or blurry vision.  There is no cough or sputum production.  The patient denies dizziness.  There is no hematuria or hematochezia.  The patient denies any muscle aches or arthritis.  The patient denies any rash.  The patient denies frequent falling or instability.  There is no history of depression  or anxiety.  All other systems were reviewed and are negative.   Physical Exam: Filed Vitals:   03/31/11 1331  BP: 120/80  Pulse: 84   the general appearance reveals a tall elderly gentleman in no acute distress.Pupils equal and reactive.   Extraocular Movements are full.  There is no scleral icterus.  The mouth and pharynx are normal.  The neck is supple.  The carotids reveal no bruits.  The jugular venous pressure is normal.  The thyroid is not enlarged.  There is no lymphadenopathy.  The chest is clear to percussion and auscultation. There are no rales or rhonchi. Expansion of the chest is symmetrical.  The precordium is quiet.  The first heart sound is normal.  The second heart sound is physiologically split.  There is no murmur gallop rub or click.  There is no abnormal lift or  heave. The pulse is irregular in atrial fibrillation clinically.  The abdomen is soft and nontender. Bowel sounds are normal. The liver and spleen are not enlarged. There Are no abdominal masses. There are no bruits.  The pedal pulses are good.  There is no phlebitis or edema.  There is no cyanosis or clubbing.  Strength is normal and symmetrical in all extremities.  There is no lateralizing weakness.  There are no sensory deficits.  The skin is warm and dry.  There is no rash.      Assessment / Plan:    The patient is to continue same medication.  His INR today is therapeutic at 2.7.  He'll return in 3 months for followup office visit and EKG

## 2011-03-31 NOTE — Assessment & Plan Note (Signed)
The patient has a history of hypercholesterolemia.  This is being managed and followed closely by his internist Dr. Zenaida Deed.  Patient is on simvastatin and is tolerating it well without myalgias or other side effects.

## 2011-03-31 NOTE — Assessment & Plan Note (Signed)
The patient has a history of atrial fibrillation.  He also has a prior history of atrial flutter.  He underwent atrial flutter ablation on the 07/29/09 by Dr. Johney Frame.  He did well with the ablation.  Essentially went back into atrial fibrillation and because of his relative lack of symptoms has been allowed to stay in nature fibrillation.  He is on long-term Coumadin.  He has had no chest pain or symptoms of congestive heart failure he's had no thromboembolic symptoms.  He's had no problem from the Coumadin. Several months ago he was having problems with rapid ventricular response to his atrial fibrillation and this improved with an increase in his dose of metoprolol.

## 2011-03-31 NOTE — Assessment & Plan Note (Signed)
The patient has a history of essential hypertension.  This has remained under good control on low-dose beta blocker

## 2011-04-28 ENCOUNTER — Ambulatory Visit (INDEPENDENT_AMBULATORY_CARE_PROVIDER_SITE_OTHER): Payer: Medicare Other | Admitting: *Deleted

## 2011-04-28 DIAGNOSIS — Z7901 Long term (current) use of anticoagulants: Secondary | ICD-10-CM

## 2011-04-28 DIAGNOSIS — I4891 Unspecified atrial fibrillation: Secondary | ICD-10-CM

## 2011-04-28 LAB — POCT INR: INR: 2.9

## 2011-05-06 ENCOUNTER — Other Ambulatory Visit: Payer: Self-pay | Admitting: Cardiology

## 2011-05-06 DIAGNOSIS — I4891 Unspecified atrial fibrillation: Secondary | ICD-10-CM

## 2011-05-26 ENCOUNTER — Ambulatory Visit (INDEPENDENT_AMBULATORY_CARE_PROVIDER_SITE_OTHER): Payer: Medicare Other | Admitting: *Deleted

## 2011-05-26 DIAGNOSIS — Z7901 Long term (current) use of anticoagulants: Secondary | ICD-10-CM

## 2011-05-26 DIAGNOSIS — I4891 Unspecified atrial fibrillation: Secondary | ICD-10-CM

## 2011-05-26 LAB — POCT INR: INR: 2.5

## 2011-06-07 ENCOUNTER — Encounter: Payer: Self-pay | Admitting: Cardiology

## 2011-06-15 LAB — CBC
HCT: 34.3 % — ABNORMAL LOW (ref 39.0–52.0)
Hemoglobin: 10.4 g/dL — ABNORMAL LOW (ref 13.0–17.0)
MCV: 67.1 fL — ABNORMAL LOW (ref 78.0–100.0)
RBC: 5.11 MIL/uL (ref 4.22–5.81)
WBC: 7.8 10*3/uL (ref 4.0–10.5)

## 2011-06-15 LAB — COMPREHENSIVE METABOLIC PANEL
AST: 19 U/L (ref 0–37)
BUN: 14 mg/dL (ref 6–23)
CO2: 27 mEq/L (ref 19–32)
Chloride: 106 mEq/L (ref 96–112)
Creatinine, Ser: 1.05 mg/dL (ref 0.4–1.5)
GFR calc Af Amer: >60 mL/min (ref >60)
GFR calc non Af Amer: >60 mL/min (ref >60)
Glucose, Bld: 112 mg/dL — ABNORMAL HIGH (ref 70–99)
Total Bilirubin: 0.6 mg/dL (ref 0.3–1.2)

## 2011-06-15 LAB — DIFFERENTIAL
Basophils Absolute: 0 10*3/uL (ref 0.0–0.1)
Eosinophils Relative: 2 % (ref 0–5)
Lymphocytes Relative: 24 % (ref 12–46)
Neutrophils Relative %: 67 % (ref 43–77)

## 2011-06-22 ENCOUNTER — Ambulatory Visit (INDEPENDENT_AMBULATORY_CARE_PROVIDER_SITE_OTHER): Payer: Medicare Other | Admitting: *Deleted

## 2011-06-22 ENCOUNTER — Ambulatory Visit (INDEPENDENT_AMBULATORY_CARE_PROVIDER_SITE_OTHER): Payer: Medicare Other | Admitting: Cardiology

## 2011-06-22 ENCOUNTER — Encounter: Payer: Self-pay | Admitting: Cardiology

## 2011-06-22 VITALS — BP 129/86 | HR 102 | Ht 74.0 in | Wt 214.0 lb

## 2011-06-22 DIAGNOSIS — I4891 Unspecified atrial fibrillation: Secondary | ICD-10-CM

## 2011-06-22 DIAGNOSIS — Z7901 Long term (current) use of anticoagulants: Secondary | ICD-10-CM

## 2011-06-22 DIAGNOSIS — Z8679 Personal history of other diseases of the circulatory system: Secondary | ICD-10-CM

## 2011-06-22 DIAGNOSIS — E7800 Pure hypercholesterolemia, unspecified: Secondary | ICD-10-CM

## 2011-06-22 DIAGNOSIS — I10 Essential (primary) hypertension: Secondary | ICD-10-CM

## 2011-06-22 DIAGNOSIS — I119 Hypertensive heart disease without heart failure: Secondary | ICD-10-CM

## 2011-06-22 NOTE — Assessment & Plan Note (Signed)
The patient is on metoprolol for his heart rhythm and also for his blood pressure.  He has not been having any dizzy spells or syncope.  Not having any symptoms to suggest congestive heart failure.

## 2011-06-22 NOTE — Assessment & Plan Note (Signed)
The patient has permanent atrial fibrillation.  We elected to leave him in atrial fibrillation.  He is not having any thromboembolic symptoms.  Not having a problem from his Coumadin.

## 2011-06-22 NOTE — Progress Notes (Signed)
Mark Rivas Date of Birth:  20-Mar-1928 [facility] Cardiology / [facility] [address]   [address] [place], [place]  [address] [phone]           Fax   [phone]  HPI: This pleasant 77 year old Mark Rivas is seen for a scheduled followup office visit.  Has a past history of atrophic fibrillation.  He underwent cardioversion in June of 2010.  He did not stay in sinus rhythm.  He had a second cardioversion on 04/04/2009.  He had an ablation of his atrial flutter in November 2010.  Eventually, he did go back into atrial fibrillation and has been essentially asymptomatic and we have left him in atrophic fibrillation on long-term Coumadin.  Has normal left ventricular function and normal diastolic function and mild to moderate left HO enlargement by echocardiogram.  He has mild pulmonary hypertension with right ventricular systolic pressure of 44.  Patient does not have any history of ischemic heart disease and has not had ischemic testing.  Current Outpatient Prescriptions  Medication Sig Dispense Refill  . cetirizine (ZYRTEC) 10 MG tablet Take 10 mg by mouth daily. Taking as needed       . doxepin (SINEQUAN) 50 MG capsule Take 50 mg by mouth daily.        [name] loratadine (CLARITIN) 10 MG tablet Take 10 mg by mouth daily.        . metoprolol succinate (TOPROL-XL) 25 MG 24 hr tablet Take 25 mg by mouth daily.       . Multiple Vitamins-Minerals (PRESERVISION AREDS PO) Take by mouth.        . Omega-3 Fatty Acids (FISH OIL) 1000 MG CAPS Take by mouth 2 (two) times daily.       [name] omeprazole (PRILOSEC) 20 MG capsule Take 20 mg by mouth daily.        . simvastatin (ZOCOR) 20 MG tablet Take 20 mg by mouth at bedtime.        [name] warfarin (COUMADIN) 5 MG tablet TAKE 1 TABLET BY MOUTH 3 DAYS PER WEEK, THEN 1 & 1/2 TABLET 4 DAYS PER WEEK  90 tablet  5    No Known Allergies  Patient Active Problem List  Diagnoses  . MIXED HYPERLIPIDEMIA  . Essential hypertension, benign  . ATRIAL FLUTTER  .  LEFT ATRIAL ENLARGEMENT  . POSTURAL LIGHTHEADEDNESS  . ATRIAL FIBRILLATION, HX OF  . Atrial fibrillation  . Paroxysmal atrial fibrillation  . Hypercholesterolemia  . Benign hypertensive heart disease without heart failure    History  Smoking status  . Former Smoker  . Quit date: 09/14/1975  Smokeless tobacco  . Not on file    History  Alcohol Use     No family history on file.  Review of Systems: The patient denies any heat or cold intolerance.  No weight gain or weight loss.  The patient denies headaches or blurry vision.  There is no cough or sputum production.  The patient denies dizziness.  There is no hematuria or hematochezia.  The patient denies any muscle aches or arthritis.  The patient denies any rash.  The patient denies frequent falling or instability.  There is no history of depression or anxiety.  All other systems were reviewed and are negative.   Physical Exam: Filed Vitals:   06/22/11 1419  BP: 129/86  Pulse: 102   General appearance reveals a well-developed, well-nourished, Mark Rivas in no distress.Pupils equal and reactive.   Extraocular Movements are full.  There is no  scleral icterus.  The mouth and pharynx are normal.  The neck is supple.  The carotids reveal no bruits.  The jugular venous pressure is normal.  The thyroid is not enlarged.  There is no lymphadenopathy.  The chest is clear to percussion and auscultation. There are no rales or rhonchi. Expansion of the chest is symmetrical.  The precordium is quiet.  The first heart sound is normal.  The second heart sound is physiologically split.  There is no murmur gallop rub or click.  There is no abnormal lift or heave.  The rhythm is irregularThe abdomen is soft and nontender. Bowel sounds are normal. The liver and spleen are not enlarged. There Are no abdominal masses. There are no bruits. The pedal pulses are good.  There is no phlebitis or edema.  There is no cyanosis or clubbing. Strength is normal  and symmetrical in all extremities.  There is no lateralizing weakness.  There are no sensory deficits.  The skin is warm and dry.  There is no rash.   EKG today shows atrial fibrillation with ventricular response of 102, and nonspecific T-wave abnormality.   Assessment / Plan: Continue same medication.  Continue monthly visits at the Coumadin clinic.  Recheck in 4 months for followup office visit

## 2011-06-22 NOTE — Patient Instructions (Signed)
continue same dose of medications  Your physician wants you to follow-up in: 4 month You will receive a reminder letter in the mail two months in advance. If you don't receive a letter, please call our office to schedule the follow-up appointment.

## 2011-07-21 ENCOUNTER — Ambulatory Visit (INDEPENDENT_AMBULATORY_CARE_PROVIDER_SITE_OTHER): Payer: Medicare Other | Admitting: *Deleted

## 2011-07-21 DIAGNOSIS — I4891 Unspecified atrial fibrillation: Secondary | ICD-10-CM

## 2011-07-21 DIAGNOSIS — Z7901 Long term (current) use of anticoagulants: Secondary | ICD-10-CM

## 2011-07-21 LAB — POCT INR: INR: 2.9

## 2011-08-18 ENCOUNTER — Ambulatory Visit (INDEPENDENT_AMBULATORY_CARE_PROVIDER_SITE_OTHER): Payer: Medicare Other | Admitting: *Deleted

## 2011-08-18 DIAGNOSIS — I4891 Unspecified atrial fibrillation: Secondary | ICD-10-CM

## 2011-08-18 DIAGNOSIS — Z7901 Long term (current) use of anticoagulants: Secondary | ICD-10-CM

## 2011-08-18 LAB — POCT INR: INR: 3.3

## 2011-09-03 ENCOUNTER — Ambulatory Visit (INDEPENDENT_AMBULATORY_CARE_PROVIDER_SITE_OTHER): Payer: Medicare Other | Admitting: *Deleted

## 2011-09-03 DIAGNOSIS — I4891 Unspecified atrial fibrillation: Secondary | ICD-10-CM

## 2011-09-03 DIAGNOSIS — Z7901 Long term (current) use of anticoagulants: Secondary | ICD-10-CM

## 2011-09-03 LAB — POCT INR: INR: 2.8

## 2011-10-01 ENCOUNTER — Ambulatory Visit (INDEPENDENT_AMBULATORY_CARE_PROVIDER_SITE_OTHER): Payer: Medicare Other | Admitting: *Deleted

## 2011-10-01 DIAGNOSIS — Z7901 Long term (current) use of anticoagulants: Secondary | ICD-10-CM

## 2011-10-01 DIAGNOSIS — I4891 Unspecified atrial fibrillation: Secondary | ICD-10-CM

## 2011-10-05 ENCOUNTER — Telehealth: Payer: Self-pay | Admitting: Cardiology

## 2011-10-05 NOTE — Telephone Encounter (Signed)
New Msg: pt wife calling wanting to speak with nurse/MD regarding changing pt medication, warfarin, dosage. Please return pt wife call to discuss further. Pt INR 4.7 as of 1/18. The INR check before INR was 4.something. Pt wife has increased pt green consumption with no change in INR. Please return pt cal to discuss further.

## 2011-10-05 NOTE — Telephone Encounter (Signed)
Discussed with  Dr. Patty Sermons and will have patient hold warfarin today and keep scheduled recheck.  No changes made at last INR except to hold for 1 day.  Advised wife

## 2011-10-15 ENCOUNTER — Ambulatory Visit (INDEPENDENT_AMBULATORY_CARE_PROVIDER_SITE_OTHER): Payer: Medicare Other

## 2011-10-15 DIAGNOSIS — Z7901 Long term (current) use of anticoagulants: Secondary | ICD-10-CM

## 2011-10-15 DIAGNOSIS — I4891 Unspecified atrial fibrillation: Secondary | ICD-10-CM

## 2011-10-28 ENCOUNTER — Ambulatory Visit (INDEPENDENT_AMBULATORY_CARE_PROVIDER_SITE_OTHER): Payer: Medicare Other | Admitting: Cardiology

## 2011-10-28 ENCOUNTER — Ambulatory Visit (INDEPENDENT_AMBULATORY_CARE_PROVIDER_SITE_OTHER): Payer: Medicare Other | Admitting: *Deleted

## 2011-10-28 ENCOUNTER — Encounter: Payer: Self-pay | Admitting: Cardiology

## 2011-10-28 VITALS — BP 130/82 | HR 80 | Ht 74.0 in | Wt 213.0 lb

## 2011-10-28 DIAGNOSIS — E782 Mixed hyperlipidemia: Secondary | ICD-10-CM

## 2011-10-28 DIAGNOSIS — I4891 Unspecified atrial fibrillation: Secondary | ICD-10-CM

## 2011-10-28 DIAGNOSIS — I119 Hypertensive heart disease without heart failure: Secondary | ICD-10-CM

## 2011-10-28 DIAGNOSIS — Z7901 Long term (current) use of anticoagulants: Secondary | ICD-10-CM

## 2011-10-28 NOTE — Assessment & Plan Note (Signed)
He remains on long-term Coumadin.  He is not having any problems with hemorrhage or excessive bruising.  No TIA symptoms

## 2011-10-28 NOTE — Patient Instructions (Signed)
Your physician recommends that you continue on your current medications as directed. Please refer to the Current Medication list given to you today. Your physician recommends that you schedule a follow-up appointment in: 4 month ov and EKG

## 2011-10-28 NOTE — Assessment & Plan Note (Signed)
The patient's blood pressure has been remaining stable on current therapy.  No dizziness or syncope.

## 2011-10-28 NOTE — Assessment & Plan Note (Signed)
The patient has a history of dyslipidemia.  His lipids are followed by Dr. Su Hilt.  He is due for a annual physical examination with Dr. Su Hilt in April for which time he will get a full panel of lab work.

## 2011-10-28 NOTE — Progress Notes (Signed)
Sedonia Small Date of Birth:  08/25/1928 [facility] [address] [place], [place]  [address] [phone]  Fax   [phone]  HPI: This pleasant 78 year old gentleman is seen for a scheduled followup office visit.  He has a history of sick sinus angina and atrial fibrillation.  He has had several cardioversions initially in June of 2010 for atrial fibrillation and then in July of 2010.  He went on to have ablation of atrial flutter in November of 2010 and did well for a while but then went back into atrial fib and since then because of his asymptomatic status we have left him in atrial fibrillation with rate control and on long-term Coumadin.  He does have normal LV function.  He does have mild to moderate left atrial enlargement by echocardiogram with normal LV function he does have mild pulmonary hypertension.  He does not give any history to suggest ischemic heart disease.  Current Outpatient Prescriptions  Medication Sig Dispense Refill  . amLODipine (NORVASC) 5 MG tablet Take 5 mg by mouth daily.      [name] doxepin (SINEQUAN) 50 MG capsule Take 50 mg by mouth daily.        [name] loratadine (CLARITIN) 10 MG tablet Take 10 mg by mouth daily.        . metoprolol succinate (TOPROL-XL) 25 MG 24 hr tablet Take 25 mg by mouth daily.       . Multiple Vitamins-Minerals (PRESERVISION AREDS PO) Take by mouth.        . Omega-3 Fatty Acids (FISH OIL) 1000 MG CAPS Take by mouth 2 (two) times daily.       [name] omeprazole (PRILOSEC) 20 MG capsule Take 20 mg by mouth daily.        . simvastatin (ZOCOR) 20 MG tablet Take 20 mg by mouth at bedtime.        [name] warfarin (COUMADIN) 5 MG tablet TAKE 1 TABLET BY MOUTH 3 DAYS PER WEEK, THEN 1 & 1/2 TABLET 4 DAYS PER WEEK  90 tablet  5    No Known Allergies  Patient Active Problem List  Diagnoses  . MIXED HYPERLIPIDEMIA  . Essential hypertension, benign  . ATRIAL FLUTTER  . LEFT ATRIAL ENLARGEMENT  . POSTURAL LIGHTHEADEDNESS  . ATRIAL  FIBRILLATION, HX OF  . Atrial fibrillation  . Paroxysmal atrial fibrillation  . Hypercholesterolemia  . Benign hypertensive heart disease without heart failure    History  Smoking status  . Former Smoker  . Quit date: 09/14/1975  Smokeless tobacco  . Not on file    History  Alcohol Use     No family history on file.  Review of Systems: The patient denies any heat or cold intolerance.  No weight gain or weight loss.  The patient denies headaches or blurry vision.  There is no cough or sputum production.  The patient denies dizziness.  There is no hematuria or hematochezia.  The patient denies any muscle aches or arthritis.  The patient denies any rash.  The patient denies frequent falling or instability.  There is no history of depression or anxiety.  All other systems were reviewed and are negative.   Physical Exam: Filed Vitals:   10/28/11 1527  BP: 130/82  Pulse: 80   appearance reveals a tall elderly gentleman in no distress.  Pupils equal and reactive.   Extraocular Movements are full.  There is no scleral icterus.  The mouth and pharynx are normal.  The neck is  supple.  The carotids reveal no bruits.  The jugular venous pressure is normal.  The thyroid is not enlarged.  There is no lymphadenopathy.  The chest is clear to percussion and auscultation. There are no rales or rhonchi. Expansion of the chest is symmetrical.  The precordium is quiet.  The first heart sound is normal.  The second heart sound is physiologically split.  There is no murmur gallop rub or click.  There is no abnormal lift or heave.  Rhythm is irregular.     the abdomen reveals a large midline scar from a previous vagotomy and pyloroplasty operation.The pedal pulses are good.  There is no phlebitis or edema.  There is no cyanosis or clubbing. Strength is normal and symmetrical in all extremities.  There is no lateralizing weakness.  There are no sensory deficits.      Assessment / Plan: Continue  same medication.  Recheck in 4 months for followup office visit and EKG

## 2011-11-15 ENCOUNTER — Ambulatory Visit (INDEPENDENT_AMBULATORY_CARE_PROVIDER_SITE_OTHER): Payer: Medicare Other | Admitting: Pharmacist

## 2011-11-15 DIAGNOSIS — I4891 Unspecified atrial fibrillation: Secondary | ICD-10-CM

## 2011-11-15 DIAGNOSIS — Z7901 Long term (current) use of anticoagulants: Secondary | ICD-10-CM

## 2011-12-09 ENCOUNTER — Ambulatory Visit (INDEPENDENT_AMBULATORY_CARE_PROVIDER_SITE_OTHER): Payer: Medicare Other | Admitting: *Deleted

## 2011-12-09 DIAGNOSIS — Z7901 Long term (current) use of anticoagulants: Secondary | ICD-10-CM

## 2011-12-09 DIAGNOSIS — I4891 Unspecified atrial fibrillation: Secondary | ICD-10-CM

## 2012-01-06 ENCOUNTER — Ambulatory Visit (INDEPENDENT_AMBULATORY_CARE_PROVIDER_SITE_OTHER): Payer: Medicare Other | Admitting: *Deleted

## 2012-01-06 DIAGNOSIS — I4891 Unspecified atrial fibrillation: Secondary | ICD-10-CM

## 2012-01-06 DIAGNOSIS — Z7901 Long term (current) use of anticoagulants: Secondary | ICD-10-CM

## 2012-02-17 ENCOUNTER — Ambulatory Visit (INDEPENDENT_AMBULATORY_CARE_PROVIDER_SITE_OTHER): Payer: Medicare Other | Admitting: *Deleted

## 2012-02-17 DIAGNOSIS — Z7901 Long term (current) use of anticoagulants: Secondary | ICD-10-CM

## 2012-02-17 DIAGNOSIS — I4891 Unspecified atrial fibrillation: Secondary | ICD-10-CM

## 2012-02-17 LAB — POCT INR: INR: 2.8

## 2012-02-28 ENCOUNTER — Other Ambulatory Visit: Payer: Self-pay | Admitting: *Deleted

## 2012-02-28 ENCOUNTER — Encounter: Payer: Self-pay | Admitting: Cardiology

## 2012-02-28 ENCOUNTER — Ambulatory Visit (INDEPENDENT_AMBULATORY_CARE_PROVIDER_SITE_OTHER): Payer: Medicare Other | Admitting: Cardiology

## 2012-02-28 VITALS — BP 120/70 | HR 100 | Ht 74.0 in | Wt 217.0 lb

## 2012-02-28 DIAGNOSIS — I4891 Unspecified atrial fibrillation: Secondary | ICD-10-CM

## 2012-02-28 DIAGNOSIS — R42 Dizziness and giddiness: Secondary | ICD-10-CM

## 2012-02-28 DIAGNOSIS — I10 Essential (primary) hypertension: Secondary | ICD-10-CM

## 2012-02-28 MED ORDER — MECLIZINE HCL 25 MG PO TABS: 25.0000 mg | ORAL_TABLET | Freq: Three times a day (TID) | ORAL | Status: DC | PRN

## 2012-02-28 MED ORDER — WARFARIN SODIUM 5 MG PO TABS: 5.0000 mg | ORAL_TABLET | ORAL | Status: DC

## 2012-02-28 NOTE — Progress Notes (Signed)
Mark Rivas Date of Birth:  08/17/1928 [facility] [address] [place], [place]  [address] [phone]  Fax   [phone]  HPI: This pleasant 78 year old gentleman is seen for a scheduled four-month followup office visit.  He has a history of sick sinus syndrome and atrial fibrillation.  He has had several cardioversions in 2010.  He went on to have ablation of atrial flutter in November of 2010 but subsequently went back into atrial fibrillation and has remained asymptomatic and we have left him in atrial fib with rate control and long-term Coumadin.  The patient does have normal LV function.  He does have mild to moderate left atrial enlargement by echo with normal LV systolic function and he does have mild pulmonary hypertension.  He has never had any symptoms of ischemic heart disease.  He does have a history of mixed hyperlipidemia and this is followed by Dr. Su Hilt.  Current Outpatient Prescriptions  Medication Sig Dispense Refill  . amLODipine (NORVASC) 5 MG tablet Take 5 mg by mouth daily.      [name] doxepin (SINEQUAN) 50 MG capsule Take 50 mg by mouth daily.        [name] loratadine (CLARITIN) 10 MG tablet Take 10 mg by mouth daily.        . metoprolol succinate (TOPROL-XL) 25 MG 24 hr tablet Take 25 mg by mouth daily.       . Multiple Vitamins-Minerals (PRESERVISION AREDS PO) Take by mouth.        . Omega-3 Fatty Acids (FISH OIL) 1000 MG CAPS Take by mouth 2 (two) times daily.       [name] omeprazole (PRILOSEC) 20 MG capsule Take 20 mg by mouth daily.        . simvastatin (ZOCOR) 20 MG tablet Take 20 mg by mouth at bedtime.        [name] DISCONTD: warfarin (COUMADIN) 5 MG tablet TAKE 1 TABLET BY MOUTH 3 DAYS PER WEEK, THEN 1 & 1/2 TABLET 4 DAYS PER WEEK  90 tablet  5  . meclizine (ANTIVERT) 25 MG tablet Take 1 tablet (25 mg total) by mouth 3 (three) times daily as needed.  30 tablet  0  . warfarin (COUMADIN) 5 MG tablet Take 1 tablet (5 mg total) by mouth as directed.  120  tablet  1    No Known Allergies  Patient Active Problem List  Diagnosis  . MIXED HYPERLIPIDEMIA  . Essential hypertension, benign  . ATRIAL FLUTTER  . LEFT ATRIAL ENLARGEMENT  . POSTURAL LIGHTHEADEDNESS  . ATRIAL FIBRILLATION, HX OF  . Atrial fibrillation  . Paroxysmal atrial fibrillation  . Hypercholesterolemia  . Benign hypertensive heart disease without heart failure  . Vertigo    History  Smoking status  . Former Smoker  . Quit date: 09/14/1975  Smokeless tobacco  . Not on file    History  Alcohol Use     No family history on file.  Review of Systems: The patient denies any heat or cold intolerance.  No weight gain or weight loss.  The patient denies headaches or blurry vision.  There is no cough or sputum production.  The patient denies dizziness.  There is no hematuria or hematochezia.  The patient denies any muscle aches or arthritis.  The patient denies any rash.  The patient denies frequent falling or instability.  There is no history of depression or anxiety.  All other systems were reviewed and are negative.   Physical Exam: Filed Vitals:  02/28/12 1415  BP: 120/70  Pulse: 100   general appearance reveals a well-developed well-nourished elderly gentleman in no distress.The head and neck exam reveals pupils equal and reactive.  Extraocular movements are full.  There is no scleral icterus.  The mouth and pharynx are normal.  The neck is supple.  The carotids reveal no bruits.  The jugular venous pressure is normal.  The  thyroid is not enlarged.  There is no lymphadenopathy.  The chest is clear to percussion and auscultation.  There are no rales or rhonchi.  Expansion of the chest is symmetrical.  The precordium is quiet.  The first heart sound is normal.  The second heart sound is physiologically split.  There is no murmur gallop rub or click.  There is no abnormal lift or heave.  The rhythm is irregularly irregular .The abdomen is soft and nontender.  The bowel  sounds are normal.  The liver and spleen are not enlarged.  There are no abdominal masses.  There are no abdominal bruits.  Extremities reveal good pedal pulses.  There is no phlebitis or edema.  There is no cyanosis or clubbing.  Strength is normal and symmetrical in all extremities.  There is no lateralizing weakness.  There are no sensory deficits.  The skin is warm and dry.  There is no rash.   EKG today shows atrial fibrillation with a controlled ventricular response and no ischemic ST-T wave changes   Assessment / Plan:  Continue on same medication.  Trial of meclizine for his vertigo.  Recheck in 4 months for followup office visit.

## 2012-02-28 NOTE — Assessment & Plan Note (Signed)
The patient has been expressing some symptoms of vertigo.  This is especially prominent when he first gets up in the morning or if he turns over quickly in bed.  As the day goes on his symptoms improved.  In the past he has used meclizine for this and we are calling in in some meclizine 25 mg every 8 hours when necessary

## 2012-02-28 NOTE — Assessment & Plan Note (Signed)
The patient has a history of established atrial fibrillation.  He is having no symptoms of CHF.  He's had no TIA symptoms and he has been compliant with taking his warfarin as prescribed.  Presently he takes warfarin 5 mg 4 days a week and 7.5 mg 3 days a week.  He has not been experiencing any hematochezia or melena or evidence of excessive bruising or bleeding

## 2012-02-28 NOTE — Assessment & Plan Note (Signed)
The patient has a history of essential hypertension.  He has not been experiencing any symptoms of congestive heart failure.  He has had some dizziness related to vertigo

## 2012-02-28 NOTE — Patient Instructions (Addendum)
Your physician recommends that you schedule a follow-up appointment in: 4 months with Dr. Patty Sermons.  Meclizine 25mg  every 8 hours as needed for vertigo.  Coumadin as directed by the coumadin clinic.

## 2012-03-30 ENCOUNTER — Ambulatory Visit (INDEPENDENT_AMBULATORY_CARE_PROVIDER_SITE_OTHER): Payer: Medicare Other | Admitting: *Deleted

## 2012-03-30 DIAGNOSIS — Z7901 Long term (current) use of anticoagulants: Secondary | ICD-10-CM

## 2012-03-30 DIAGNOSIS — I4891 Unspecified atrial fibrillation: Secondary | ICD-10-CM

## 2012-05-11 ENCOUNTER — Ambulatory Visit (INDEPENDENT_AMBULATORY_CARE_PROVIDER_SITE_OTHER): Payer: Medicare Other | Admitting: Pharmacist

## 2012-05-11 DIAGNOSIS — Z7901 Long term (current) use of anticoagulants: Secondary | ICD-10-CM

## 2012-05-11 DIAGNOSIS — I4891 Unspecified atrial fibrillation: Secondary | ICD-10-CM

## 2012-05-11 LAB — POCT INR: INR: 2.7

## 2012-06-22 ENCOUNTER — Ambulatory Visit (INDEPENDENT_AMBULATORY_CARE_PROVIDER_SITE_OTHER): Payer: Medicare Other | Admitting: *Deleted

## 2012-06-22 DIAGNOSIS — I4891 Unspecified atrial fibrillation: Secondary | ICD-10-CM

## 2012-06-22 DIAGNOSIS — Z7901 Long term (current) use of anticoagulants: Secondary | ICD-10-CM

## 2012-06-22 LAB — POCT INR: INR: 2.8

## 2012-07-05 ENCOUNTER — Encounter: Payer: Self-pay | Admitting: Cardiology

## 2012-07-05 ENCOUNTER — Ambulatory Visit: Payer: Medicare Other | Admitting: Cardiology

## 2012-07-05 ENCOUNTER — Ambulatory Visit (INDEPENDENT_AMBULATORY_CARE_PROVIDER_SITE_OTHER): Payer: Medicare Other | Admitting: Cardiology

## 2012-07-05 VITALS — BP 138/82 | HR 78 | Ht 74.0 in | Wt 218.8 lb

## 2012-07-05 DIAGNOSIS — E7800 Pure hypercholesterolemia, unspecified: Secondary | ICD-10-CM

## 2012-07-05 DIAGNOSIS — I4891 Unspecified atrial fibrillation: Secondary | ICD-10-CM

## 2012-07-05 NOTE — Assessment & Plan Note (Signed)
The patient is not having any side effects from his statin therapy

## 2012-07-05 NOTE — Progress Notes (Signed)
Mark Rivas Date of Birth:  [birth year] [facility] [address] [place], [place]  [address] [phone]  Fax   [phone]  HPI: This pleasant 78 year old gentleman is seen for a scheduled four-month followup office visit. He has a history of sick sinus syndrome and atrial fibrillation. He has had several cardioversions in 2010. He went on to have ablation of atrial flutter in November of 2010 but subsequently went back into atrial fibrillation and has remained asymptomatic and we have left him in atrial fib with rate control and long-term Coumadin. The patient does have normal LV function. He does have mild to moderate left atrial enlargement by echo with normal LV systolic function and he does have mild pulmonary hypertension. He has never had any symptoms of ischemic heart disease. He does have a history of mixed hyperlipidemia and this is followed by Dr. Su Hilt.   Current Outpatient Prescriptions  Medication Sig Dispense Refill  . amLODipine (NORVASC) 5 MG tablet Take 5 mg by mouth daily.      [name] doxepin (SINEQUAN) 50 MG capsule Take 50 mg by mouth daily.        [name] loratadine (CLARITIN) 10 MG tablet Take 10 mg by mouth daily.        . metoprolol succinate (TOPROL-XL) 25 MG 24 hr tablet Take 25 mg by mouth daily.       . Omega-3 Fat Ac-Cholecalciferol (DRY EYE OMEGA BENEFITS/VIT D-3 PO) Take by mouth as directed.      . Omega-3 Fatty Acids (FISH OIL) 1000 MG CAPS Take 2 capsules by mouth 2 (two) times daily.       [name] omeprazole (PRILOSEC) 20 MG capsule Take 20 mg by mouth daily.        . simvastatin (ZOCOR) 20 MG tablet Take 20 mg by mouth at bedtime.        [name] warfarin (COUMADIN) 5 MG tablet Take 1 tablet (5 mg total) by mouth as directed.  120 tablet  1    No Known Allergies  Patient Active Problem List  Diagnosis  . MIXED HYPERLIPIDEMIA  . Essential hypertension, benign  . ATRIAL FLUTTER  . LEFT ATRIAL ENLARGEMENT  . POSTURAL LIGHTHEADEDNESS  . ATRIAL  FIBRILLATION, HX OF  . Atrial fibrillation  . Paroxysmal atrial fibrillation  . Hypercholesterolemia  . Benign hypertensive heart disease without heart failure  . Vertigo    History  Smoking status  . Former Smoker  . Quit date: 09/14/1975  Smokeless tobacco  . Not on file    History  Alcohol Use     No family history on file.  Review of Systems: The patient denies any heat or cold intolerance.  No weight gain or weight loss.  The patient denies headaches or blurry vision.  There is no cough or sputum production.  The patient denies dizziness.  There is no hematuria or hematochezia.  The patient denies any muscle aches or arthritis.  The patient denies any rash.  The patient denies frequent falling or instability.  There is no history of depression or anxiety.  All other systems were reviewed and are negative.   Physical Exam: Filed Vitals:   07/05/12 1222  BP: 138/82  Pulse: 78   the general appearance reveals a tall gentleman in no acute distress.The head and neck exam reveals pupils equal and reactive.  Extraocular movements are full.  There is no scleral icterus.  The mouth and pharynx are normal.  The neck is supple.  The  carotids reveal no bruits.  The jugular venous pressure is normal.  The  thyroid is not enlarged.  There is no lymphadenopathy.  The chest is clear to percussion and auscultation.  There are no rales or rhonchi.  Expansion of the chest is symmetrical.  The precordium is quiet.  The rhythm is irregular The first heart sound is normal.  The second heart sound is physiologically split.  There is no murmur gallop rub or click.  There is no abnormal lift or heave.  The abdomen is soft and nontender.  The bowel sounds are normal.  The liver and spleen are not enlarged.  There are no abdominal masses.  There are no abdominal bruits.  Extremities reveal good pedal pulses.  There is no phlebitis or edema.  There is no cyanosis or clubbing.  Strength is normal and  symmetrical in all extremities.  There is no lateralizing weakness.  There are no sensory deficits.  The skin is warm and dry.  There is no rash.      Assessment / Plan: Continue same medication.  Work on getting her weight down a little more.  Weight is up 1 pound this visit.  Recheck in 4 months for followup office visit.

## 2012-07-05 NOTE — Patient Instructions (Addendum)
Your physician recommends that you schedule a follow-up appointment in: 4 month ov  Your physician recommends that you continue on your current medications as directed. Please refer to the Current Medication list given to you today.

## 2012-07-05 NOTE — Assessment & Plan Note (Signed)
The patient has had no TIA symptoms.  Is not having any symptoms of congestive heart failure from his atrial fib.  He tries to walk for at least 20 minutes a day either on his treadmill or outside

## 2012-07-24 ENCOUNTER — Other Ambulatory Visit: Payer: Self-pay | Admitting: *Deleted

## 2012-07-24 DIAGNOSIS — I4891 Unspecified atrial fibrillation: Secondary | ICD-10-CM

## 2012-07-24 MED ORDER — WARFARIN SODIUM 5 MG PO TABS: 5.0000 mg | ORAL_TABLET | ORAL | Status: DC

## 2012-08-03 ENCOUNTER — Ambulatory Visit (INDEPENDENT_AMBULATORY_CARE_PROVIDER_SITE_OTHER): Payer: Medicare Other | Admitting: *Deleted

## 2012-08-03 DIAGNOSIS — I4891 Unspecified atrial fibrillation: Secondary | ICD-10-CM

## 2012-08-03 DIAGNOSIS — Z7901 Long term (current) use of anticoagulants: Secondary | ICD-10-CM

## 2012-09-14 ENCOUNTER — Ambulatory Visit (INDEPENDENT_AMBULATORY_CARE_PROVIDER_SITE_OTHER): Payer: Medicare Other | Admitting: *Deleted

## 2012-09-14 DIAGNOSIS — Z7901 Long term (current) use of anticoagulants: Secondary | ICD-10-CM

## 2012-09-14 DIAGNOSIS — I4891 Unspecified atrial fibrillation: Secondary | ICD-10-CM

## 2012-09-14 LAB — POCT INR: INR: 2.4

## 2012-11-03 ENCOUNTER — Ambulatory Visit (INDEPENDENT_AMBULATORY_CARE_PROVIDER_SITE_OTHER): Payer: Medicare Other

## 2012-11-03 ENCOUNTER — Encounter: Payer: Self-pay | Admitting: Cardiology

## 2012-11-03 ENCOUNTER — Ambulatory Visit (INDEPENDENT_AMBULATORY_CARE_PROVIDER_SITE_OTHER): Payer: Medicare Other | Admitting: Cardiology

## 2012-11-03 VITALS — BP 134/80 | HR 84 | Ht 74.0 in | Wt 219.0 lb

## 2012-11-03 DIAGNOSIS — Z7901 Long term (current) use of anticoagulants: Secondary | ICD-10-CM

## 2012-11-03 DIAGNOSIS — I119 Hypertensive heart disease without heart failure: Secondary | ICD-10-CM

## 2012-11-03 DIAGNOSIS — I4891 Unspecified atrial fibrillation: Secondary | ICD-10-CM

## 2012-11-03 MED ORDER — WARFARIN SODIUM 5 MG PO TABS: ORAL_TABLET | ORAL | Status: DC

## 2012-11-03 NOTE — Assessment & Plan Note (Signed)
The patient is an established chronic permanent atrial fibrillation.  He is not having any symptoms of palpitations or awareness of his rhythm.  He remains on long-term Coumadin.  He has had no TIA symptoms.

## 2012-11-03 NOTE — Progress Notes (Signed)
Mark Rivas Date of Birth:  1927-11-29 [facility] [address] [place], [place]  [address] [phone]  Fax   [phone]  HPI: This pleasant 79 year old gentleman is seen for a scheduled four-month followup office visit. He has a history of sick sinus syndrome and atrial fibrillation. He has had several cardioversions in 2010. He went on to have ablation of atrial flutter in November of 2010 but subsequently went back into atrial fibrillation and has remained asymptomatic and we have left him in atrial fib with rate control and long-term Coumadin. The patient does have normal LV function. He does have mild to moderate left atrial enlargement by echo with normal LV systolic function and he does have mild pulmonary hypertension. He has never had any symptoms of ischemic heart disease. He does have a history of mixed hyperlipidemia and this is followed by Dr. Su Hilt.  Since last visit the patient has been doing well except for some sinus congestion.  This has also caused him to have some increasing his balance problem and his vertigo symptoms.   Current Outpatient Prescriptions  Medication Sig Dispense Refill  . amLODipine (NORVASC) 5 MG tablet Take 5 mg by mouth daily.      [name] doxepin (SINEQUAN) 50 MG capsule Take 50 mg by mouth daily.        [name] loratadine (CLARITIN) 10 MG tablet Take 10 mg by mouth daily.        . metoprolol succinate (TOPROL-XL) 25 MG 24 hr tablet Take 25 mg by mouth daily.       . Omega-3 Fat Ac-Cholecalciferol (DRY EYE OMEGA BENEFITS/VIT D-3 PO) Take by mouth as directed.      . Omega-3 Fatty Acids (FISH OIL) 1000 MG CAPS Take 2 capsules by mouth 2 (two) times daily.       [name] omeprazole (PRILOSEC) 20 MG capsule Take 20 mg by mouth daily.        . simvastatin (ZOCOR) 20 MG tablet Take 20 mg by mouth at bedtime.        [name] warfarin (COUMADIN) 5 MG tablet Take as directed by anticoagulation clinic  120 tablet  1   No current facility-administered  medications for this visit.    No Known Allergies  Patient Active Problem List  Diagnosis  . MIXED HYPERLIPIDEMIA  . Essential hypertension, benign  . ATRIAL FLUTTER  . LEFT ATRIAL ENLARGEMENT  . POSTURAL LIGHTHEADEDNESS  . ATRIAL FIBRILLATION, HX OF  . Atrial fibrillation  . Paroxysmal atrial fibrillation  . Hypercholesterolemia  . Benign hypertensive heart disease without heart failure  . Vertigo    History  Smoking status  . Former Smoker  . Quit date: 09/14/1975  Smokeless tobacco  . Not on file    History  Alcohol Use     No family history on file.  Review of Systems: The patient denies any heat or cold intolerance.  No weight gain or weight loss.  The patient denies headaches or blurry vision.  There is no cough or sputum production.  The patient denies dizziness.  There is no hematuria or hematochezia.  The patient denies any muscle aches or arthritis.  The patient denies any rash.  The patient denies frequent falling or instability.  There is no history of depression or anxiety.  All other systems were reviewed and are negative.   Physical Exam: Filed Vitals:   11/03/12 1437  BP: 134/80  Pulse: 84   the general appearance reveals a well-developed well-nourished gentleman  in distress.The head and neck exam reveals pupils equal and reactive.  Extraocular movements are full.  There is no scleral icterus.  The mouth and pharynx are normal.  The neck is supple.  The carotids reveal no bruits.  The jugular venous pressure is normal.  The  thyroid is not enlarged.  There is no lymphadenopathy.  The chest is clear to percussion and auscultation.  There are no rales or rhonchi.  Expansion of the chest is symmetrical.  The precordium is quiet.  The pulse is irregular  The first heart sound is normal.  The second heart sound is physiologically split.  There is no murmur gallop rub or click.  There is no abnormal lift or heave.  The abdomen is soft and nontender.  The bowel  sounds are normal.  The liver and spleen are not enlarged.  There are no abdominal masses.  There are no abdominal bruits.  Extremities reveal good pedal pulses.  There is no phlebitis or edema.  There is no cyanosis or clubbing.  Strength is normal and symmetrical in all extremities.  There is no lateralizing weakness.  There are no sensory deficits.  The skin is warm and dry.  There is no rash.      Assessment / Plan: Continue same medication.  We refilled his warfarin prescription today.  Recheck in 4 months for office visit and EKG

## 2012-11-03 NOTE — Patient Instructions (Addendum)
Will have you get your coumadin check today  Your physician recommends that you continue on your current medications as directed. Please refer to the Current Medication list given to you today.  Your physician recommends that you schedule a follow-up appointment in: 4 month ov/ekg

## 2012-11-03 NOTE — Assessment & Plan Note (Signed)
Blood pressures remaining stable on current therapy.  No headaches or dizziness.  No syncope. 

## 2012-12-14 ENCOUNTER — Ambulatory Visit (INDEPENDENT_AMBULATORY_CARE_PROVIDER_SITE_OTHER): Payer: Medicare Other | Admitting: *Deleted

## 2012-12-14 DIAGNOSIS — Z7901 Long term (current) use of anticoagulants: Secondary | ICD-10-CM

## 2012-12-14 DIAGNOSIS — I4891 Unspecified atrial fibrillation: Secondary | ICD-10-CM

## 2013-01-25 ENCOUNTER — Ambulatory Visit (INDEPENDENT_AMBULATORY_CARE_PROVIDER_SITE_OTHER): Payer: Medicare Other | Admitting: *Deleted

## 2013-01-25 DIAGNOSIS — Z7901 Long term (current) use of anticoagulants: Secondary | ICD-10-CM

## 2013-01-25 DIAGNOSIS — I4891 Unspecified atrial fibrillation: Secondary | ICD-10-CM

## 2013-01-25 LAB — POCT INR: INR: 2.4

## 2013-02-27 ENCOUNTER — Encounter: Payer: Self-pay | Admitting: Cardiology

## 2013-02-27 ENCOUNTER — Ambulatory Visit (INDEPENDENT_AMBULATORY_CARE_PROVIDER_SITE_OTHER): Payer: Medicare Other | Admitting: *Deleted

## 2013-02-27 ENCOUNTER — Ambulatory Visit (INDEPENDENT_AMBULATORY_CARE_PROVIDER_SITE_OTHER): Payer: Medicare Other | Admitting: Cardiology

## 2013-02-27 VITALS — BP 132/80 | HR 87 | Ht 74.0 in | Wt 216.8 lb

## 2013-02-27 DIAGNOSIS — I10 Essential (primary) hypertension: Secondary | ICD-10-CM

## 2013-02-27 DIAGNOSIS — I4891 Unspecified atrial fibrillation: Secondary | ICD-10-CM

## 2013-02-27 DIAGNOSIS — Z7901 Long term (current) use of anticoagulants: Secondary | ICD-10-CM

## 2013-02-27 LAB — POCT INR: INR: 2.1

## 2013-02-27 NOTE — Assessment & Plan Note (Signed)
EKG shows atrial flutter fibrillation with controlled ventricular response.  The patient has not been having any tachycardia palpitations.  He has not been experiencing any TIA symptoms.

## 2013-02-27 NOTE — Progress Notes (Signed)
Mark Rivas Date of Birth:  01/31/1928 [facility] [address] [place], [place]  [address] [phone]         Fax   [phone]  History of Present Illness: This pleasant 79 year old gentleman is seen for a scheduled four-month followup office visit. He has a history of sick sinus syndrome and atrial fibrillation. He has had several cardioversions in 2010. He went on to have ablation of atrial flutter in November of 2010 but subsequently went back into atrial fibrillation and has remained asymptomatic and we have left him in atrial fib with rate control and long-term Coumadin. The patient does have normal LV function. He does have mild to moderate left atrial enlargement by echo with normal LV systolic function and he does have mild pulmonary hypertension. He has never had any symptoms of ischemic heart disease. He does have a history of mixed hyperlipidemia and this is followed by Dr. Su Hilt. Since last visit the patient has been doing well except for some sinus congestion. This has also caused him to have some increasing his balance problem and his vertigo symptoms.   Current Outpatient Prescriptions  Medication Sig Dispense Refill  . amLODipine (NORVASC) 5 MG tablet Take 5 mg by mouth daily.      [name] doxepin (SINEQUAN) 50 MG capsule Take 50 mg by mouth daily.        [name] loratadine (CLARITIN) 10 MG tablet Take 10 mg by mouth daily.        . metoprolol succinate (TOPROL-XL) 25 MG 24 hr tablet Take 25 mg by mouth daily.       . Omega-3 Fat Ac-Cholecalciferol (DRY EYE OMEGA BENEFITS/VIT D-3 PO) Take by mouth as directed.      . Omega-3 Fatty Acids (FISH OIL) 1000 MG CAPS Take 2 capsules by mouth 2 (two) times daily.       [name] omeprazole (PRILOSEC) 20 MG capsule Take 20 mg by mouth daily.        . simvastatin (ZOCOR) 20 MG tablet Take 20 mg by mouth at bedtime.        [name] warfarin (COUMADIN) 5 MG tablet Take as directed by anticoagulation clinic  120 tablet  1   No current  facility-administered medications for this visit.    No Known Allergies  Patient Active Problem List   Diagnosis Date Noted  . Atrial fibrillation 12/09/2010    Priority: High  . Essential hypertension, benign 07/21/2009    Priority: High  . Vertigo 02/28/2012  . Benign hypertensive heart disease without heart failure 03/31/2011  . Paroxysmal atrial fibrillation   . Hypercholesterolemia   . POSTURAL LIGHTHEADEDNESS 12/12/2009  . MIXED HYPERLIPIDEMIA 07/21/2009  . ATRIAL FLUTTER 07/21/2009  . LEFT ATRIAL ENLARGEMENT 07/21/2009  . ATRIAL FIBRILLATION, HX OF 07/21/2009    History  Smoking status  . Former Smoker  . Quit date: 09/14/1975  Smokeless tobacco  . Not on file    History  Alcohol Use     No family history on file.  Review of Systems: Constitutional: no fever chills diaphoresis or fatigue or change in weight.  Head and neck: no hearing loss, no epistaxis, no photophobia or visual disturbance. Respiratory: No cough, shortness of breath or wheezing. Cardiovascular: No chest pain peripheral edema, palpitations. Gastrointestinal: No abdominal distention, no abdominal pain, no change in bowel habits hematochezia or melena. Genitourinary: No dysuria, no frequency, no urgency, no nocturia. Musculoskeletal:No arthralgias, no back pain, no gait disturbance or myalgias. Neurological: No dizziness, no  headaches, no numbness, no seizures, no syncope, no weakness, no tremors. Hematologic: No lymphadenopathy, no easy bruising. Psychiatric: No confusion, no hallucinations, no sleep disturbance.    Physical Exam: Filed Vitals:   02/27/13 1335  BP: 132/80  Pulse: 87   the general appearance reveals a well-developed well-nourished gentleman in no distress.The head and neck exam reveals pupils equal and reactive.  Extraocular movements are full.  There is no scleral icterus.  The mouth and pharynx are normal.  The neck is supple.  The carotids reveal no bruits.  The jugular  venous pressure is normal.  The  thyroid is not enlarged.  There is no lymphadenopathy.  The chest is clear to percussion and auscultation.  There are no rales or rhonchi.  Expansion of the chest is symmetrical.  The pulse is irregularly irregular  The precordium is quiet.  The first heart sound is normal.  The second heart sound is physiologically split.  There is no murmur gallop rub or click.  There is no abnormal lift or heave.  The abdomen is soft and nontender.  The bowel sounds are normal.  The liver and spleen are not enlarged.  There are no abdominal masses.  There are no abdominal bruits.  Extremities reveal good pedal pulses.  There is no phlebitis or edema.  There is no cyanosis or clubbing.  Strength is normal and symmetrical in all extremities.  There is no lateralizing weakness.  There are no sensory deficits.  The skin is warm and dry.  There is no rash.   EKG shows atrial fibrillation which is coarse and which is rate controlled.  No ischemic changes.  Assessment / Plan: Continue on same medication.  Recheck in 4 months for followup office visit.

## 2013-02-27 NOTE — Assessment & Plan Note (Signed)
Blood pressure was remaining stable on current therapy.  No dizziness or syncope.  He does complain of lack of energy and his PCP is evaluating him for low testosterone levels.

## 2013-02-27 NOTE — Patient Instructions (Addendum)
Your physician wants you to follow-up in: 4 months You will receive a reminder letter in the mail two months in advance. If you don't receive a letter, please call our office to schedule the follow-up appointment.     .Your physician recommends that you continue on your current medications as directed. Please refer to the Current Medication list given to you today.  

## 2013-04-05 ENCOUNTER — Ambulatory Visit (INDEPENDENT_AMBULATORY_CARE_PROVIDER_SITE_OTHER): Payer: Medicare Other

## 2013-04-05 DIAGNOSIS — I4891 Unspecified atrial fibrillation: Secondary | ICD-10-CM

## 2013-04-05 DIAGNOSIS — Z7901 Long term (current) use of anticoagulants: Secondary | ICD-10-CM

## 2013-05-17 ENCOUNTER — Ambulatory Visit (INDEPENDENT_AMBULATORY_CARE_PROVIDER_SITE_OTHER): Payer: Medicare Other | Admitting: Pharmacist

## 2013-05-17 DIAGNOSIS — Z7901 Long term (current) use of anticoagulants: Secondary | ICD-10-CM

## 2013-05-17 DIAGNOSIS — I4891 Unspecified atrial fibrillation: Secondary | ICD-10-CM

## 2013-05-17 LAB — POCT INR: INR: 3.6

## 2013-05-30 ENCOUNTER — Ambulatory Visit (INDEPENDENT_AMBULATORY_CARE_PROVIDER_SITE_OTHER): Payer: Medicare Other | Admitting: Pharmacist

## 2013-05-30 DIAGNOSIS — Z7901 Long term (current) use of anticoagulants: Secondary | ICD-10-CM

## 2013-05-30 DIAGNOSIS — I4891 Unspecified atrial fibrillation: Secondary | ICD-10-CM

## 2013-05-30 LAB — POCT INR: INR: 2.4

## 2013-06-28 ENCOUNTER — Ambulatory Visit (INDEPENDENT_AMBULATORY_CARE_PROVIDER_SITE_OTHER): Payer: Medicare Other | Admitting: *Deleted

## 2013-06-28 ENCOUNTER — Encounter: Payer: Self-pay | Admitting: Cardiology

## 2013-06-28 ENCOUNTER — Ambulatory Visit (INDEPENDENT_AMBULATORY_CARE_PROVIDER_SITE_OTHER): Payer: Medicare Other | Admitting: Cardiology

## 2013-06-28 VITALS — BP 140/80 | HR 80 | Ht 74.0 in | Wt 223.0 lb

## 2013-06-28 DIAGNOSIS — E7800 Pure hypercholesterolemia, unspecified: Secondary | ICD-10-CM

## 2013-06-28 DIAGNOSIS — Z7901 Long term (current) use of anticoagulants: Secondary | ICD-10-CM

## 2013-06-28 DIAGNOSIS — I10 Essential (primary) hypertension: Secondary | ICD-10-CM

## 2013-06-28 DIAGNOSIS — I4891 Unspecified atrial fibrillation: Secondary | ICD-10-CM

## 2013-06-28 DIAGNOSIS — I119 Hypertensive heart disease without heart failure: Secondary | ICD-10-CM

## 2013-06-28 DIAGNOSIS — I480 Paroxysmal atrial fibrillation: Secondary | ICD-10-CM

## 2013-06-28 NOTE — Patient Instructions (Signed)
Work harder on [employer]  Your physician recommends that you continue on your current medications as directed. Please refer to the Current Medication list given to you today.  Your physician wants you to follow-up in: 4 month ov/ekg You will receive a reminder letter in the mail two months in advance. If you don't receive a letter, please call our office to schedule the follow-up appointment.

## 2013-06-28 NOTE — Assessment & Plan Note (Signed)
His lipids are followed by Dr. Su Hilt.  Since last visit the patient has not been as careful with his diet and had been on vacation at the beach and his weight is up 7 pounds.

## 2013-06-28 NOTE — Assessment & Plan Note (Signed)
The patient is in atrial fibrillation with a controlled ventricular response.  He has not been experiencing any TIA symptoms.  Is not having any problems from his Coumadin

## 2013-06-28 NOTE — Assessment & Plan Note (Signed)
Blood pressure was remaining stable on current therapy.  No headaches dizziness or syncope.  He walks on the school track 2 miles about 3 times a week without difficulty.

## 2013-06-28 NOTE — Progress Notes (Signed)
Mark Rivas Date of Birth:  1928/06/14 [address] [place], [place]  [address] [phone]         Fax   [phone]  History of Present Illness: This pleasant 79 year old gentleman is seen for a scheduled four-month followup office visit. He has a history of sick sinus syndrome and atrial fibrillation. He has had several cardioversions in 2010. He went on to have ablation of atrial flutter in November of 2010 but subsequently went back into atrial fibrillation and has remained asymptomatic and we have left him in atrial fib with rate control and long-term Coumadin. The patient does have normal LV function. He does have mild to moderate left atrial enlargement by echo with normal LV systolic function and he does have mild pulmonary hypertension. He has never had any symptoms of ischemic heart disease. He does have a history of mixed hyperlipidemia and this is followed by Dr. Su Hilt. Since last visit the patient has been doing well except for some sinus congestion. This has also caused him to have some increasing his balance problem and his vertigo symptoms.   Current Outpatient Prescriptions  Medication Sig Dispense Refill  . amLODipine (NORVASC) 5 MG tablet Take 5 mg by mouth daily.      . Cholecalciferol (VITAMIN D3) 1000 UNITS CAPS Take 1 capsule by mouth daily.      [name] doxepin (SINEQUAN) 50 MG capsule Take 50 mg by mouth daily.        [name] loratadine (CLARITIN) 10 MG tablet Take 10 mg by mouth daily.        . metoprolol succinate (TOPROL-XL) 25 MG 24 hr tablet Take 25 mg by mouth daily.       . Omega-3 Fat Ac-Cholecalciferol (DRY EYE OMEGA BENEFITS/VIT D-3 PO) Take by mouth as directed.      . Omega-3 Fatty Acids (FISH OIL) 1000 MG CAPS Take 2 capsules by mouth 2 (two) times daily.       [name] omeprazole (PRILOSEC) 20 MG capsule Take 20 mg by mouth daily.        . simvastatin (ZOCOR) 20 MG tablet Take 20 mg by mouth at bedtime.        [name] warfarin (COUMADIN) 5 MG tablet Take  as directed by anticoagulation clinic  120 tablet  1   No current facility-administered medications for this visit.    No Known Allergies  Patient Active Problem List   Diagnosis Date Noted  . Atrial fibrillation 12/09/2010    Priority: High  . Essential hypertension, benign 07/21/2009    Priority: High  . Vertigo 02/28/2012  . Benign hypertensive heart disease without heart failure 03/31/2011  . Paroxysmal atrial fibrillation   . Hypercholesterolemia   . POSTURAL LIGHTHEADEDNESS 12/12/2009  . MIXED HYPERLIPIDEMIA 07/21/2009  . ATRIAL FLUTTER 07/21/2009  . LEFT ATRIAL ENLARGEMENT 07/21/2009  . ATRIAL FIBRILLATION, HX OF 07/21/2009    History  Smoking status  . Former Smoker  . Quit date: 09/14/1975  Smokeless tobacco  . Not on file    History  Alcohol Use     No family history on file.  Review of Systems: Constitutional: no fever chills diaphoresis or fatigue or change in weight.  Head and neck: no hearing loss, no epistaxis, no photophobia or visual disturbance. Respiratory: No cough, shortness of breath or wheezing. Cardiovascular: No chest pain peripheral edema, palpitations. Gastrointestinal: No abdominal distention, no abdominal pain, no change in bowel habits hematochezia or melena. Genitourinary: No dysuria, no frequency, no  urgency, no nocturia. Musculoskeletal:No arthralgias, no back pain, no gait disturbance or myalgias. Neurological: No dizziness, no headaches, no numbness, no seizures, no syncope, no weakness, no tremors. Hematologic: No lymphadenopathy, no easy bruising. Psychiatric: No confusion, no hallucinations, no sleep disturbance.    Physical Exam: Filed Vitals:   06/28/13 1441  BP: 140/80  Pulse: 80   the general appearance reveals a well-developed well-nourished gentleman in no distress.The head and neck exam reveals pupils equal and reactive.  Extraocular movements are full.  There is no scleral icterus.  The mouth and pharynx are  normal.  The neck is supple.  The carotids reveal no bruits.  The jugular venous pressure is normal.  The  thyroid is not enlarged.  There is no lymphadenopathy.  The chest is clear to percussion and auscultation.  There are no rales or rhonchi.  Expansion of the chest is symmetrical.  The pulse is irregularly irregular  The precordium is quiet.  The first heart sound is normal.  The second heart sound is physiologically split.  There is no murmur gallop rub or click.  There is no abnormal lift or heave.  The abdomen is soft and nontender.  The bowel sounds are normal.  The liver and spleen are not enlarged.  There are no abdominal masses.  There are no abdominal bruits.  Extremities reveal good pedal pulses.  There is no phlebitis or edema.  There is no cyanosis or clubbing.  Strength is normal and symmetrical in all extremities.  There is no lateralizing weakness.  There are no sensory deficits.  The skin is warm and dry.  There is no rash.     Assessment / Plan: Continue on same medication.  Recheck in 4 months for followup office visit.  And EKG.

## 2013-08-07 ENCOUNTER — Ambulatory Visit (INDEPENDENT_AMBULATORY_CARE_PROVIDER_SITE_OTHER): Payer: Medicare Other | Admitting: *Deleted

## 2013-08-07 DIAGNOSIS — I4891 Unspecified atrial fibrillation: Secondary | ICD-10-CM

## 2013-08-07 DIAGNOSIS — Z7901 Long term (current) use of anticoagulants: Secondary | ICD-10-CM

## 2013-08-07 LAB — POCT INR: INR: 2.5

## 2013-08-23 ENCOUNTER — Other Ambulatory Visit: Payer: Self-pay | Admitting: Cardiology

## 2013-08-23 NOTE — Telephone Encounter (Signed)
Follow up    Refill  Warfarin----[facility]-----will be out of medication sat

## 2013-09-18 ENCOUNTER — Ambulatory Visit (INDEPENDENT_AMBULATORY_CARE_PROVIDER_SITE_OTHER): Payer: Medicare Other | Admitting: *Deleted

## 2013-09-18 DIAGNOSIS — Z7901 Long term (current) use of anticoagulants: Secondary | ICD-10-CM

## 2013-09-18 DIAGNOSIS — I4891 Unspecified atrial fibrillation: Secondary | ICD-10-CM

## 2013-09-18 LAB — POCT INR: INR: 2

## 2013-10-31 ENCOUNTER — Encounter: Payer: Self-pay | Admitting: Cardiology

## 2013-10-31 ENCOUNTER — Ambulatory Visit (INDEPENDENT_AMBULATORY_CARE_PROVIDER_SITE_OTHER): Payer: Medicare Other | Admitting: *Deleted

## 2013-10-31 ENCOUNTER — Ambulatory Visit (INDEPENDENT_AMBULATORY_CARE_PROVIDER_SITE_OTHER): Payer: Medicare Other | Admitting: Cardiology

## 2013-10-31 VITALS — BP 128/80 | HR 82 | Ht 74.0 in | Wt 223.0 lb

## 2013-10-31 DIAGNOSIS — Z7901 Long term (current) use of anticoagulants: Secondary | ICD-10-CM

## 2013-10-31 DIAGNOSIS — E7800 Pure hypercholesterolemia, unspecified: Secondary | ICD-10-CM

## 2013-10-31 DIAGNOSIS — I4891 Unspecified atrial fibrillation: Secondary | ICD-10-CM

## 2013-10-31 DIAGNOSIS — I119 Hypertensive heart disease without heart failure: Secondary | ICD-10-CM

## 2013-10-31 LAB — POCT INR: INR: 2.6

## 2013-10-31 MED ORDER — WARFARIN SODIUM 5 MG PO TABS: ORAL_TABLET | ORAL | Status: DC

## 2013-10-31 NOTE — Progress Notes (Signed)
Mark Rivas Date of Birth:  02-06-1928 [address] [place], [place]  [id number] [phone]         Fax   [phone]  History of Present Illness: This pleasant 80 year old gentleman is seen for a scheduled four-month followup office visit. He has a history of sick sinus syndrome and atrial fibrillation. He has had several cardioversions in 2010. He went on to have ablation of atrial flutter in November of 2010 but subsequently went back into atrial fibrillation and has remained asymptomatic and we have left him in atrial fib with rate control and long-term Coumadin. The patient does have normal LV function. He does have mild to moderate left atrial enlargement by echo with normal LV systolic function and he does have mild pulmonary hypertension. He has never had any symptoms of ischemic heart disease. He does have a history of mixed hyperlipidemia and this is followed by Dr. Su Hiltoberts. Since last visit the patient has been doing well except for some sinus congestion. This has also caused him to have some increasing his balance problem and his vertigo symptoms.  He has been experiencing some osteoarthritis with morning stiffness in his mid upper back.  This responds to Tylenol.   Current Outpatient Prescriptions  Medication Sig Dispense Refill  . amLODipine (NORVASC) 5 MG tablet Take 5 mg by mouth daily.      . Cholecalciferol (VITAMIN D3) 1000 UNITS CAPS Take 1 capsule by mouth daily.      [name]. doxepin (SINEQUAN) 50 MG capsule Take 50 mg by mouth daily.        [name]. loratadine (CLARITIN) 10 MG tablet Take 10 mg by mouth daily.        . metoprolol succinate (TOPROL-XL) 25 MG 24 hr tablet Take 25 mg by mouth daily.       . Omega-3 Fat Ac-Cholecalciferol (DRY EYE OMEGA BENEFITS/VIT D-3 PO) Take by mouth as directed.      . Omega-3 Fatty Acids (FISH OIL) 1000 MG CAPS Take 2 capsules by mouth 2 (two) times daily.       [name]. omeprazole (PRILOSEC) 20 MG capsule Take 20 mg by mouth daily.         . simvastatin (ZOCOR) 20 MG tablet Take 20 mg by mouth at bedtime.        [name]. warfarin (COUMADIN) 5 MG tablet TAKE AS DIRECTED BY ANTICOAGULATION CLINIC  120 tablet  1   No current facility-administered medications for this visit.    No Known Allergies  Patient Active Problem List   Diagnosis Date Noted  . Atrial fibrillation 12/09/2010    Priority: High  . Essential hypertension, benign 07/21/2009    Priority: High  . Vertigo 02/28/2012  . Benign hypertensive heart disease without heart failure 03/31/2011  . Paroxysmal atrial fibrillation   . Hypercholesterolemia   . POSTURAL LIGHTHEADEDNESS 12/12/2009  . MIXED HYPERLIPIDEMIA 07/21/2009  . ATRIAL FLUTTER 07/21/2009  . LEFT ATRIAL ENLARGEMENT 07/21/2009  . ATRIAL FIBRILLATION, HX OF 07/21/2009    History  Smoking status  . Former Smoker  . Quit date: 09/14/1975  Smokeless tobacco  . Not on file    History  Alcohol Use     No family history on file.  Review of Systems: Constitutional: no fever chills diaphoresis or fatigue or change in weight.  Head and neck: no hearing loss, no epistaxis, no photophobia or visual disturbance. Respiratory: No cough, shortness of breath or wheezing. Cardiovascular: No chest pain peripheral edema, palpitations. Gastrointestinal:  No abdominal distention, no abdominal pain, no change in bowel habits hematochezia or melena. Genitourinary: No dysuria, no frequency, no urgency, no nocturia. Musculoskeletal:No arthralgias, no back pain, no gait disturbance or myalgias. Neurological: No dizziness, no headaches, no numbness, no seizures, no syncope, no weakness, no tremors. Hematologic: No lymphadenopathy, no easy bruising. Psychiatric: No confusion, no hallucinations, no sleep disturbance.    Physical Exam: Filed Vitals:   10/31/13 1346  BP: 128/80  Pulse: 82   the general appearance reveals a well-developed well-nourished gentleman in no distress.The head and neck exam reveals  pupils equal and reactive.  Extraocular movements are full.  There is no scleral icterus.  The mouth and pharynx are normal.  The neck is supple.  The carotids reveal no bruits.  The jugular venous pressure is normal.  The  thyroid is not enlarged.  There is no lymphadenopathy.  The chest is clear to percussion and auscultation.  There are no rales or rhonchi.  Expansion of the chest is symmetrical.  The pulse is irregularly irregular  The precordium is quiet.  The first heart sound is normal.  The second heart sound is physiologically split.  There is no murmur gallop rub or click.  There is no abnormal lift or heave.  The abdomen is soft and nontender.  The bowel sounds are normal.  The liver and spleen are not enlarged.  There are no abdominal masses.  There are no abdominal bruits.  Extremities reveal good pedal pulses.  There is no phlebitis or edema.  There is no cyanosis or clubbing.  Strength is normal and symmetrical in all extremities.  There is no lateralizing weakness.  There are no sensory deficits.  The skin is warm and dry.  There is no rash.   EKG today shows atrial fibrillation with controlled ventricular response.  Since 02/27/13, no significant change.  There is indeterminant axis.  Assessment / Plan: The patient is doing well.  Continue on same medication.  Recheck in 4 months for followup office visit.

## 2013-10-31 NOTE — Assessment & Plan Note (Signed)
The patient is not having any headaches or dizziness.  No symptoms of CHF.  Blood pressure has been remaining in normal levels.

## 2013-10-31 NOTE — Patient Instructions (Signed)
Your physician recommends that you continue on your current medications as directed. Please refer to the Current Medication list given to you today.  Your physician recommends that you schedule a follow-up appointment in: 4 month ov  

## 2013-10-31 NOTE — Assessment & Plan Note (Signed)
The patient is really not aware of his heart rate.  He has not been aware of any racing of his heart.  He has not had any TIA or stroke symptoms.  He has not had any complications from long-term Coumadin.

## 2013-10-31 NOTE — Assessment & Plan Note (Signed)
Patient has a history of hypercholesterolemia.  His weight is unchanged since last visit and he did not gain any weight over the holidays.  His lipids are followed by his PCP, Dr. Su Hiltoberts.

## 2013-12-10 ENCOUNTER — Encounter: Payer: Self-pay | Admitting: Cardiology

## 2013-12-12 ENCOUNTER — Ambulatory Visit (INDEPENDENT_AMBULATORY_CARE_PROVIDER_SITE_OTHER): Payer: Medicare Other | Admitting: *Deleted

## 2013-12-12 DIAGNOSIS — Z5181 Encounter for therapeutic drug level monitoring: Secondary | ICD-10-CM

## 2013-12-12 DIAGNOSIS — Z7901 Long term (current) use of anticoagulants: Secondary | ICD-10-CM

## 2013-12-12 DIAGNOSIS — I4891 Unspecified atrial fibrillation: Secondary | ICD-10-CM

## 2013-12-12 LAB — POCT INR: INR: 2.3

## 2014-01-23 ENCOUNTER — Ambulatory Visit (INDEPENDENT_AMBULATORY_CARE_PROVIDER_SITE_OTHER): Payer: Medicare Other | Admitting: Surgery

## 2014-01-23 DIAGNOSIS — Z7901 Long term (current) use of anticoagulants: Secondary | ICD-10-CM

## 2014-01-23 DIAGNOSIS — I4891 Unspecified atrial fibrillation: Secondary | ICD-10-CM

## 2014-01-23 DIAGNOSIS — Z5181 Encounter for therapeutic drug level monitoring: Secondary | ICD-10-CM

## 2014-01-23 LAB — POCT INR: INR: 3.2

## 2014-02-20 ENCOUNTER — Ambulatory Visit (INDEPENDENT_AMBULATORY_CARE_PROVIDER_SITE_OTHER): Payer: Medicare Other | Admitting: *Deleted

## 2014-02-20 DIAGNOSIS — Z5181 Encounter for therapeutic drug level monitoring: Secondary | ICD-10-CM

## 2014-02-20 DIAGNOSIS — I4891 Unspecified atrial fibrillation: Secondary | ICD-10-CM

## 2014-02-20 DIAGNOSIS — Z7901 Long term (current) use of anticoagulants: Secondary | ICD-10-CM

## 2014-02-20 LAB — POCT INR: INR: 2.3

## 2014-02-28 ENCOUNTER — Ambulatory Visit (INDEPENDENT_AMBULATORY_CARE_PROVIDER_SITE_OTHER): Payer: Medicare Other | Admitting: Cardiology

## 2014-02-28 ENCOUNTER — Encounter: Payer: Self-pay | Admitting: Cardiology

## 2014-02-28 VITALS — BP 131/81 | HR 71 | Ht 74.0 in | Wt 218.4 lb

## 2014-02-28 DIAGNOSIS — R42 Dizziness and giddiness: Secondary | ICD-10-CM

## 2014-02-28 DIAGNOSIS — I119 Hypertensive heart disease without heart failure: Secondary | ICD-10-CM

## 2014-02-28 DIAGNOSIS — I482 Chronic atrial fibrillation, unspecified: Secondary | ICD-10-CM

## 2014-02-28 DIAGNOSIS — I4891 Unspecified atrial fibrillation: Secondary | ICD-10-CM

## 2014-02-28 MED ORDER — MECLIZINE HCL 25 MG PO TABS: 25.0000 mg | ORAL_TABLET | Freq: Three times a day (TID) | ORAL | Status: DC | PRN

## 2014-02-28 NOTE — Assessment & Plan Note (Signed)
His symptoms of vertigo have recurred.  We refilled his meclizine prescription today.

## 2014-02-28 NOTE — Progress Notes (Signed)
Mark Rivas D Wake Date of Birth:  [birth year] [facility] [address] [place], [place]  [address] [phone]        Fax   [phone]   History of Present Illness: This pleasant 80 year old gentleman is seen for a scheduled four-month followup office visit. He has a history of sick sinus syndrome and atrial fibrillation. He has had several cardioversions in 2010. He went on to have ablation of atrial flutter in November of 2010 but subsequently went back into atrial fibrillation and has remained asymptomatic and we have left him in atrial fib with rate control and long-term Coumadin. The patient does have normal LV function. He does have mild to moderate left atrial enlargement by echo with normal LV systolic function and he does have mild pulmonary hypertension. He has never had any symptoms of ischemic heart disease. He does have a history of mixed hyperlipidemia and this is followed by Dr. Su Hiltoberts. Since last visit the patient has been doing well except for some sinus congestion. This has also caused him to have some increasing his balance problem and his vertigo symptoms. He has been experiencing some osteoarthritis with morning stiffness in his mid upper back. This responds to Tylenol.   Current Outpatient Prescriptions  Medication Sig Dispense Refill  . amLODipine (NORVASC) 5 MG tablet Take 5 mg by mouth daily.      . Cholecalciferol (VITAMIN D3) 1000 UNITS CAPS Take 1 capsule by mouth daily.      [name]. doxepin (SINEQUAN) 50 MG capsule Take 50 mg by mouth daily.        [name]. loratadine (CLARITIN) 10 MG tablet Take 10 mg by mouth daily.        . metoprolol succinate (TOPROL-XL) 25 MG 24 hr tablet Take 25 mg by mouth daily.       . Omega-3 Fat Ac-Cholecalciferol (DRY EYE OMEGA BENEFITS/VIT D-3 PO) Take by mouth as directed.      . Omega-3 Fatty Acids (FISH OIL) 1000 MG CAPS Take 2 capsules by mouth 2 (two) times daily.       [name]. omeprazole (PRILOSEC) 20 MG capsule Take 20 mg  by mouth daily.        . simvastatin (ZOCOR) 20 MG tablet Take 20 mg by mouth at bedtime.        [name]. warfarin (COUMADIN) 5 MG tablet TAKE AS DIRECTED BY ANTICOAGULATION CLINIC  120 tablet  1  . meclizine (ANTIVERT) 25 MG tablet Take 1 tablet (25 mg total) by mouth 3 (three) times daily as needed.  30 tablet  0  . meclizine (ANTIVERT) 25 MG tablet Take 1 tablet (25 mg total) by mouth 3 (three) times daily as needed for dizziness.  30 tablet  5   No current facility-administered medications for this visit.    No Known Allergies  Patient Active Problem List   Diagnosis Date Noted  . Atrial fibrillation 12/09/2010    Priority: High  . Essential hypertension, benign 07/21/2009    Priority: High  . Encounter for therapeutic drug monitoring 12/12/2013  . Vertigo 02/28/2012  . Benign hypertensive heart disease without heart failure 03/31/2011  . Paroxysmal atrial fibrillation   . Hypercholesterolemia   . POSTURAL LIGHTHEADEDNESS 12/12/2009  . MIXED HYPERLIPIDEMIA 07/21/2009  . ATRIAL FLUTTER 07/21/2009  . LEFT ATRIAL ENLARGEMENT 07/21/2009  . ATRIAL FIBRILLATION, HX OF 07/21/2009    History  Smoking status  . Former Smoker  . Quit date: 09/14/1975  Smokeless tobacco  .  Not on file    History  Alcohol Use     History reviewed. No pertinent family history.  Review of Systems: Constitutional: no fever chills diaphoresis or fatigue or change in weight.  Head and neck: no hearing loss, no epistaxis, no photophobia or visual disturbance. Respiratory: No cough, shortness of breath or wheezing. Cardiovascular: No chest pain peripheral edema, palpitations. Gastrointestinal: No abdominal distention, no abdominal pain, no change in bowel habits hematochezia or melena. Genitourinary: No dysuria, no frequency, no urgency, no nocturia. Musculoskeletal:No arthralgias, no back pain, no gait disturbance or myalgias. Neurological: No dizziness, no headaches, no numbness, no seizures, no  syncope, no weakness, no tremors. Hematologic: No lymphadenopathy, no easy bruising. Psychiatric: No confusion, no hallucinations, no sleep disturbance.    Physical Exam: Filed Vitals:   02/28/14 1350  BP: 131/81  Pulse: 71   the general appearance reveals a well-developed well-nourished elderly gentleman in no distress.The head and neck exam reveals pupils equal and reactive.  Extraocular movements are full.  There is no scleral icterus.  The mouth and pharynx are normal.  The neck is supple.  The carotids reveal no bruits.  The jugular venous pressure is normal.  The  thyroid is not enlarged.  There is no lymphadenopathy.  The chest is clear to percussion and auscultation.  There are no rales or rhonchi.  Expansion of the chest is symmetrical.  The precordium is quiet.  The pulse is irregularly irregular.  The first heart sound is normal.  The second heart sound is physiologically split.  There is no murmur gallop rub or click.  There is no abnormal lift or heave.  The abdomen is soft and nontender.  The bowel sounds are normal.  The liver and spleen are not enlarged.  There are no abdominal masses.  There are no abdominal bruits.  Extremities reveal good pedal pulses.  There is no phlebitis or edema.  There is no cyanosis or clubbing.  Strength is normal and symmetrical in all extremities.  There is no lateralizing weakness.  There are no sensory deficits.  The skin is warm and dry.  There is no rash.     Assessment / Plan: 1. permanent atrial fibrillation 2. Vertigo 3.  Hypertensive heart disease without heart failure 4. hypercholesterolemia  Plan continue same medication.  Use meclizine as necessary for severe vertigo symptoms.  Recheck in 4 months for office visit and EKG.

## 2014-02-28 NOTE — Assessment & Plan Note (Signed)
The patient is not having any symptoms of congestive heart failure.  He tries to walk each day for exercise.

## 2014-02-28 NOTE — Assessment & Plan Note (Signed)
He remains in atrial fibrillation with controlled ventricular response.  He remains on long-term Coumadin anticoagulation.  He has not had any TIA symptoms.  He is not having any GI bleeding from the Coumadin.

## 2014-02-28 NOTE — Patient Instructions (Signed)
Your physician has recommended you make the following change in your medication: Sent in prescription for Meclizine ( 25 mg ) three times a day as [facility] [place].    Your physician wants you to follow-up in: 4 months with a EKG.  You will receive a reminder letter in the mail two months in advance. If you don't receive a letter, please call our office to schedule the follow-up appointment @ [phone]

## 2014-03-25 ENCOUNTER — Ambulatory Visit (INDEPENDENT_AMBULATORY_CARE_PROVIDER_SITE_OTHER): Payer: Medicare Other | Admitting: *Deleted

## 2014-03-25 DIAGNOSIS — Z7901 Long term (current) use of anticoagulants: Secondary | ICD-10-CM

## 2014-03-25 DIAGNOSIS — I4891 Unspecified atrial fibrillation: Secondary | ICD-10-CM

## 2014-03-25 DIAGNOSIS — Z5181 Encounter for therapeutic drug level monitoring: Secondary | ICD-10-CM

## 2014-03-25 LAB — POCT INR: INR: 3.4

## 2014-04-09 ENCOUNTER — Ambulatory Visit (INDEPENDENT_AMBULATORY_CARE_PROVIDER_SITE_OTHER): Payer: Medicare Other

## 2014-04-09 DIAGNOSIS — I4891 Unspecified atrial fibrillation: Secondary | ICD-10-CM

## 2014-04-09 DIAGNOSIS — Z7901 Long term (current) use of anticoagulants: Secondary | ICD-10-CM

## 2014-04-09 DIAGNOSIS — Z5181 Encounter for therapeutic drug level monitoring: Secondary | ICD-10-CM

## 2014-04-09 LAB — POCT INR: INR: 2.6

## 2014-04-29 ENCOUNTER — Ambulatory Visit (INDEPENDENT_AMBULATORY_CARE_PROVIDER_SITE_OTHER): Payer: Medicare Other | Admitting: Pharmacist

## 2014-04-29 DIAGNOSIS — I4891 Unspecified atrial fibrillation: Secondary | ICD-10-CM

## 2014-04-29 DIAGNOSIS — Z7901 Long term (current) use of anticoagulants: Secondary | ICD-10-CM

## 2014-04-29 DIAGNOSIS — Z5181 Encounter for therapeutic drug level monitoring: Secondary | ICD-10-CM

## 2014-04-29 LAB — POCT INR: INR: 2.3

## 2014-05-29 ENCOUNTER — Ambulatory Visit (INDEPENDENT_AMBULATORY_CARE_PROVIDER_SITE_OTHER): Payer: Medicare Other | Admitting: *Deleted

## 2014-05-29 DIAGNOSIS — I4891 Unspecified atrial fibrillation: Secondary | ICD-10-CM

## 2014-05-29 DIAGNOSIS — Z5181 Encounter for therapeutic drug level monitoring: Secondary | ICD-10-CM

## 2014-05-29 DIAGNOSIS — Z7901 Long term (current) use of anticoagulants: Secondary | ICD-10-CM

## 2014-05-29 LAB — POCT INR: INR: 2.6

## 2014-06-12 ENCOUNTER — Other Ambulatory Visit: Payer: Self-pay | Admitting: Cardiology

## 2014-07-01 ENCOUNTER — Ambulatory Visit (INDEPENDENT_AMBULATORY_CARE_PROVIDER_SITE_OTHER): Payer: Medicare Other | Admitting: *Deleted

## 2014-07-01 ENCOUNTER — Ambulatory Visit (INDEPENDENT_AMBULATORY_CARE_PROVIDER_SITE_OTHER): Payer: Medicare Other | Admitting: Cardiology

## 2014-07-01 ENCOUNTER — Encounter: Payer: Self-pay | Admitting: Cardiology

## 2014-07-01 VITALS — BP 108/70 | HR 87 | Ht 74.0 in | Wt 219.0 lb

## 2014-07-01 DIAGNOSIS — I482 Chronic atrial fibrillation, unspecified: Secondary | ICD-10-CM

## 2014-07-01 DIAGNOSIS — Z7901 Long term (current) use of anticoagulants: Secondary | ICD-10-CM

## 2014-07-01 DIAGNOSIS — I119 Hypertensive heart disease without heart failure: Secondary | ICD-10-CM

## 2014-07-01 DIAGNOSIS — R42 Dizziness and giddiness: Secondary | ICD-10-CM

## 2014-07-01 DIAGNOSIS — Z5181 Encounter for therapeutic drug level monitoring: Secondary | ICD-10-CM

## 2014-07-01 DIAGNOSIS — I4891 Unspecified atrial fibrillation: Secondary | ICD-10-CM

## 2014-07-01 DIAGNOSIS — I10 Essential (primary) hypertension: Secondary | ICD-10-CM

## 2014-07-01 LAB — POCT INR: INR: 2.5

## 2014-07-01 NOTE — Assessment & Plan Note (Signed)
The patient has not had any TIA symptoms.  He has not had any complications from the warfarin.

## 2014-07-01 NOTE — Assessment & Plan Note (Signed)
His blood pressure is running low normal today.  We will stop his 5 mg amlodipine.  If his blood pressure becomes subsequently too high we will consider restarting a smaller dose

## 2014-07-01 NOTE — Patient Instructions (Signed)
STOP AMLODIPINE   Your physician wants you to follow-up in: 4 MONT OV You will receive a reminder letter in the mail two months in advance. If you don't receive a letter, please call our office to schedule the follow-up appointment.

## 2014-07-01 NOTE — Progress Notes (Signed)
Mark Rivas Date of Birth:  06/29/1928 [facility] [address] [place], [place]  [address] [phone]        Fax   [phone]   History of Present Illness: This pleasant 80 year old gentleman is seen for a scheduled four-month followup office visit. He has a history of sick sinus syndrome and atrial fibrillation. He has had several cardioversions in 2010. He went on to have ablation of atrial flutter in November of 2010 but subsequently went back into atrial fibrillation and has remained asymptomatic and we have left him in atrial fib with rate control and long-term Coumadin. The patient does have normal LV function. He does have mild to moderate left atrial enlargement by echo with normal LV systolic function and he does have mild pulmonary hypertension. He has never had any symptoms of ischemic heart disease. He does have a history of mixed hyperlipidemia and this is followed by Dr. Su Hiltoberts.  His blood pressure has been running lower and he isn't lightheaded at times when he stands up.  His INR today is therapeutic at 2.5.   Current Outpatient Prescriptions  Medication Sig Dispense Refill  . Cholecalciferol (VITAMIN D3) 1000 UNITS CAPS Take 1 capsule by mouth daily.      [name]. doxepin (SINEQUAN) 50 MG capsule Take 50 mg by mouth daily.        [name]. loratadine (CLARITIN) 10 MG tablet Take 10 mg by mouth daily.        . meclizine (ANTIVERT) 25 MG tablet Take 1 tablet (25 mg total) by mouth 3 (three) times daily as needed for dizziness.  30 tablet  5  . metoprolol succinate (TOPROL-XL) 25 MG 24 hr tablet Take 25 mg by mouth daily.       . Omega-3 Fatty Acids (FISH OIL) 1000 MG CAPS Take 2 capsules by mouth 2 (two) times daily.       [name]. omeprazole (PRILOSEC) 20 MG capsule Take 20 mg by mouth daily.        . simvastatin (ZOCOR) 20 MG tablet Take 20 mg by mouth at bedtime.        [name]. warfarin (COUMADIN) 5 MG tablet TAKE AS DIRECTED BY ANTICOAGULATION CLINIC  120 tablet  1  .  meclizine (ANTIVERT) 25 MG tablet Take 1 tablet (25 mg total) by mouth 3 (three) times daily as needed.  30 tablet  0   No current facility-administered medications for this visit.    No Known Allergies  Patient Active Problem List   Diagnosis Date Noted  . Atrial fibrillation 12/09/2010    Priority: High  . Essential hypertension, benign 07/21/2009    Priority: High  . Orthostatic lightheadedness 07/01/2014  . Encounter for therapeutic drug monitoring 12/12/2013  . Vertigo 02/28/2012  . Benign hypertensive heart disease without heart failure 03/31/2011  . Paroxysmal atrial fibrillation   . Hypercholesterolemia   . POSTURAL LIGHTHEADEDNESS 12/12/2009  . MIXED HYPERLIPIDEMIA 07/21/2009  . ATRIAL FLUTTER 07/21/2009  . LEFT ATRIAL ENLARGEMENT 07/21/2009  . ATRIAL FIBRILLATION, HX OF 07/21/2009    History  Smoking status  . Former Smoker  . Quit date: 09/14/1975  Smokeless tobacco  . Not on file    History  Alcohol Use     No family history on file.  Review of Systems: Constitutional: no fever chills diaphoresis or fatigue or change in weight.  Head and neck: no hearing loss, no epistaxis, no photophobia or visual disturbance. Respiratory: No cough, shortness of breath  or wheezing. Cardiovascular: No chest pain peripheral edema, palpitations. Gastrointestinal: No abdominal distention, no abdominal pain, no change in bowel habits hematochezia or melena. Genitourinary: No dysuria, no frequency, no urgency, no nocturia. Musculoskeletal:No arthralgias, no back pain, no gait disturbance or myalgias. Neurological: No dizziness, no headaches, no numbness, no seizures, no syncope, no weakness, no tremors. Hematologic: No lymphadenopathy, no easy bruising. Psychiatric: No confusion, no hallucinations, no sleep disturbance.    Physical Exam: Filed Vitals:   07/01/14 1430  BP: 108/70  Pulse: 87   the general appearance reveals a well-developed well-nourished elderly  gentleman in no distress.The head and neck exam reveals pupils equal and reactive.  Extraocular movements are full.  There is no scleral icterus.  The mouth and pharynx are normal.  The neck is supple.  The carotids reveal no bruits.  The jugular venous pressure is normal.  The  thyroid is not enlarged.  There is no lymphadenopathy.  The chest is clear to percussion and auscultation.  There are no rales or rhonchi.  Expansion of the chest is symmetrical.  The precordium is quiet.  The pulse is irregularly irregular.  The first heart sound is normal.  The second heart sound is physiologically split.  There is no murmur gallop rub or click.  There is no abnormal lift or heave.  The abdomen is soft and nontender.  The bowel sounds are normal.  The liver and spleen are not enlarged.  There are no abdominal masses.  There are no abdominal bruits.  Extremities reveal good pedal pulses.  There is no phlebitis or edema.  There is no cyanosis or clubbing.  Strength is normal and symmetrical in all extremities.  There is no lateralizing weakness.  There are no sensory deficits.  The skin is warm and dry.  There is no rash.  EKG today shows atrial fibrillation with controlled ventricular response and no ischemic changes.   Assessment / Plan: 1. permanent atrial fibrillation 2. Vertigo 3.  Hypertensive heart disease without heart failure 4.  Lightheadedness when standing, consistent with low blood pressure  Plan: Stop amlodipine and observe response.  Recheck in 4 months for office visit 4. hypercholesterolemia  Plan continue same medication.  Use meclizine as necessary for severe vertigo symptoms.  Recheck in 4 months for office visit and EKG.

## 2014-08-12 ENCOUNTER — Ambulatory Visit (INDEPENDENT_AMBULATORY_CARE_PROVIDER_SITE_OTHER): Payer: Medicare Other | Admitting: Pharmacist

## 2014-08-12 DIAGNOSIS — I482 Chronic atrial fibrillation, unspecified: Secondary | ICD-10-CM

## 2014-08-12 DIAGNOSIS — Z7901 Long term (current) use of anticoagulants: Secondary | ICD-10-CM

## 2014-08-12 DIAGNOSIS — I4891 Unspecified atrial fibrillation: Secondary | ICD-10-CM

## 2014-08-12 DIAGNOSIS — Z5181 Encounter for therapeutic drug level monitoring: Secondary | ICD-10-CM

## 2014-08-12 LAB — POCT INR: INR: 2.7

## 2014-09-24 ENCOUNTER — Ambulatory Visit (INDEPENDENT_AMBULATORY_CARE_PROVIDER_SITE_OTHER): Payer: Medicare Other | Admitting: *Deleted

## 2014-09-24 DIAGNOSIS — I482 Chronic atrial fibrillation, unspecified: Secondary | ICD-10-CM

## 2014-09-24 DIAGNOSIS — Z7901 Long term (current) use of anticoagulants: Secondary | ICD-10-CM | POA: Diagnosis not present

## 2014-09-24 DIAGNOSIS — Z5181 Encounter for therapeutic drug level monitoring: Secondary | ICD-10-CM | POA: Diagnosis not present

## 2014-09-24 DIAGNOSIS — I4891 Unspecified atrial fibrillation: Secondary | ICD-10-CM | POA: Diagnosis not present

## 2014-09-24 LAB — POCT INR: INR: 2.5

## 2014-10-14 ENCOUNTER — Ambulatory Visit: Payer: Medicare Other | Admitting: Cardiology

## 2014-10-18 ENCOUNTER — Ambulatory Visit (INDEPENDENT_AMBULATORY_CARE_PROVIDER_SITE_OTHER): Payer: Medicare Other | Admitting: Cardiology

## 2014-10-18 ENCOUNTER — Encounter: Payer: Self-pay | Admitting: Cardiology

## 2014-10-18 ENCOUNTER — Ambulatory Visit (INDEPENDENT_AMBULATORY_CARE_PROVIDER_SITE_OTHER): Payer: Medicare Other

## 2014-10-18 VITALS — BP 146/82 | HR 78 | Ht 74.0 in | Wt 219.0 lb

## 2014-10-18 DIAGNOSIS — E7800 Pure hypercholesterolemia, unspecified: Secondary | ICD-10-CM

## 2014-10-18 DIAGNOSIS — I4891 Unspecified atrial fibrillation: Secondary | ICD-10-CM | POA: Diagnosis not present

## 2014-10-18 DIAGNOSIS — I10 Essential (primary) hypertension: Secondary | ICD-10-CM | POA: Diagnosis not present

## 2014-10-18 DIAGNOSIS — I482 Chronic atrial fibrillation, unspecified: Secondary | ICD-10-CM

## 2014-10-18 DIAGNOSIS — Z7901 Long term (current) use of anticoagulants: Secondary | ICD-10-CM

## 2014-10-18 DIAGNOSIS — Z5181 Encounter for therapeutic drug level monitoring: Secondary | ICD-10-CM

## 2014-10-18 LAB — POCT INR: INR: 2.4

## 2014-10-18 MED ORDER — AMLODIPINE BESYLATE 2.5 MG PO TABS: 2.5000 mg | ORAL_TABLET | Freq: Every day | ORAL | Status: DC

## 2014-10-18 MED ORDER — WARFARIN SODIUM 5 MG PO TABS: ORAL_TABLET | ORAL | Status: DC

## 2014-10-18 NOTE — Patient Instructions (Signed)
RESTART AMLODIPINE 2.5 MG 1 TABLET DAILY, [facility]   Your physician recommends that you schedule a follow-up appointment in: 4 MONTH OV/EKG

## 2014-10-18 NOTE — Progress Notes (Signed)
Cardiology Office Note   Date:  10/18/2014   ID:  Mark Rivas, DOB 05/19/1928, MRN [id number]  PCP:  Lorenda PeckOBERTS, RONALD WAYNE, MD  Cardiologist:   Cassell Clementhomas Allyana Vogan, MD   No chief complaint on file.     History of Present Illness: Mark SmallBilly D Talent is a 81 y.o. male who presents for follow-up office visit. This pleasant 81 year old gentleman is seen for a scheduled four-month followup office visit. He has a history of sick sinus syndrome and atrial fibrillation. He has had several cardioversions in 2010. He went on to have ablation of atrial flutter in November of 2010 but subsequently went back into atrial fibrillation and has remained asymptomatic and we have left him in atrial fib with rate control and long-term Coumadin. The patient does have normal LV function. He does have mild to moderate left atrial enlargement by echo with normal LV systolic function and he does have mild pulmonary hypertension. He has never had any symptoms of ischemic heart disease. He does have a history of mixed hyperlipidemia and this is followed by Dr. Su Hiltoberts. At his last visit he was having symptoms of transient orthostatic hypotension when he stood up.  We stopped his amlodipine 5 mg daily at that time and those symptoms have improved.  However now he is mildly hypertensive.. He has not been having any chest pain or shortness of breath. He has not had any TIA or stroke symptoms. Past Medical History  Diagnosis Date  . Paroxysmal atrial fibrillation   . Hypercholesterolemia   . History of anemia   . Dyslipidemia   . Allergic rhinitis   . Macular degeneration   . Borderline diabetes   . Gout     Past Surgical History  Procedure Laterality Date  . Cardiac electrophysiology study and ablation  [id number]  . Lung surgery  1977    re-tumor  . Stomach surgery  1975    re-duodenal ulcer  . Colonoscopy    . Hemorrhoid surgery       Current Outpatient Prescriptions  Medication Sig Dispense Refill  .  Cholecalciferol (VITAMIN D3) 1000 UNITS CAPS Take 1 capsule by mouth daily.    [name]. doxepin (SINEQUAN) 50 MG capsule Take 50 mg by mouth daily.      [name]. loratadine (CLARITIN) 10 MG tablet Take 10 mg by mouth daily.      . meclizine (ANTIVERT) 25 MG tablet Take 1 tablet (25 mg total) by mouth 3 (three) times daily as needed for dizziness. 30 tablet 5  . metoprolol succinate (TOPROL-XL) 25 MG 24 hr tablet Take 25 mg by mouth daily.     . Omega-3 Fatty Acids (FISH OIL) 1000 MG CAPS Take 2 capsules by mouth 2 (two) times daily.     [name]. omeprazole (PRILOSEC) 20 MG capsule Take 20 mg by mouth daily.      . simvastatin (ZOCOR) 20 MG tablet Take 20 mg by mouth at bedtime.      [name]. warfarin (COUMADIN) 5 MG tablet TAKE AS DIRECTED BY ANTICOAGULATION CLINIC 120 tablet 1  . amLODipine (NORVASC) 2.5 MG tablet Take 1 tablet (2.5 mg total) by mouth daily. 90 tablet 1   No current facility-administered medications for this visit.    Allergies:   Review of patient's allergies indicates no known allergies.    Social History:  The patient  reports that he quit smoking about 39 years ago. He does not have any smokeless tobacco history on file.   Family History:  The  patient's family history is not on file.    ROS:  Please see the history of present illness.   Otherwise, review of systems are positive for recent upper respiratory infection.  He is taking Mucinex..   All other systems are reviewed and negative.    PHYSICAL EXAM: VS:  BP 146/82 mmHg  Pulse 78  Ht  (1.88 m)  Wt 219 lb (99.338 kg)  BMI 28.11 kg/m2 , BMI Body mass index is 28.11 kg/(m^2). GEN: Well nourished, well developed, in no acute distress HEENT: normal Neck: no JVD, carotid bruits, or masses Cardiac: Pulse is irregularly irregular and atrial fibrillation.  no murmurs, rubs, or gallops,no edema  Respiratory:  clear to auscultation bilaterally, normal work of breathing GI: soft, nontender, nondistended, + BS MS: no deformity or  atrophy Skin: warm and dry, no rash Neuro:  Strength and sensation are intact Psych: euthymic mood, full affect   EKG:  EKG is not ordered today.    Recent Labs: No results found for requested labs within last 365 days.    Lipid Panel No results found for: CHOL, TRIG, HDL, CHOLHDL, VLDL, LDLCALC, LDLDIRECT    Wt Readings from Last 3 Encounters:  10/18/14 219 lb (99.338 kg)  07/01/14 219 lb (99.338 kg)  02/28/14 218 lb 6.4 oz (99.066 kg)      Other studies Reviewed:    ASSESSMENT AND PLAN:  1. permanent atrial fibrillation 2. Vertigo 3. Hypertensive heart disease without heart failure 4. Prior orthostatic hypotension, resolved  Current medicines are reviewed at length with the patient today.  The patient does not have concerns regarding medicines.  The following changes have been made:  We are restarting amlodipine in the lower dose of 2.5 mg daily in order to treat his mild essential hypertension.  Labs/ tests ordered today include: None  No orders of the defined types were placed in this encounter.     Disposition:   FU with Dr. Patty Sermons in 4 months for office visit and EKG.   Signed, Cassell Clement, MD  10/18/2014 3:21 PM    [facility] [address] [place], [place], [place]  [address] Phone: [phone]; Fax: [phone]

## 2014-11-28 ENCOUNTER — Ambulatory Visit (INDEPENDENT_AMBULATORY_CARE_PROVIDER_SITE_OTHER): Payer: Medicare Other

## 2014-11-28 DIAGNOSIS — Z7901 Long term (current) use of anticoagulants: Secondary | ICD-10-CM | POA: Diagnosis not present

## 2014-11-28 DIAGNOSIS — Z5181 Encounter for therapeutic drug level monitoring: Secondary | ICD-10-CM | POA: Diagnosis not present

## 2014-11-28 DIAGNOSIS — I4891 Unspecified atrial fibrillation: Secondary | ICD-10-CM | POA: Diagnosis not present

## 2014-11-28 LAB — POCT INR: INR: 2.4

## 2015-01-01 ENCOUNTER — Other Ambulatory Visit: Payer: Self-pay | Admitting: Internal Medicine

## 2015-01-01 ENCOUNTER — Ambulatory Visit
Admission: RE | Admit: 2015-01-01 | Discharge: 2015-01-01 | Disposition: A | Discharge status: Home / Self Care | Payer: Medicare Other | Source: Ambulatory Visit | Attending: Internal Medicine | Admitting: Internal Medicine

## 2015-01-01 DIAGNOSIS — E784 Other hyperlipidemia: Secondary | ICD-10-CM | POA: Diagnosis not present

## 2015-01-01 DIAGNOSIS — Z1211 Encounter for screening for malignant neoplasm of colon: Secondary | ICD-10-CM | POA: Diagnosis not present

## 2015-01-01 DIAGNOSIS — R05 Cough: Secondary | ICD-10-CM | POA: Diagnosis not present

## 2015-01-01 DIAGNOSIS — R1032 Left lower quadrant pain: Secondary | ICD-10-CM | POA: Diagnosis not present

## 2015-01-01 DIAGNOSIS — Z131 Encounter for screening for diabetes mellitus: Secondary | ICD-10-CM | POA: Diagnosis not present

## 2015-01-01 DIAGNOSIS — N529 Male erectile dysfunction, unspecified: Secondary | ICD-10-CM | POA: Diagnosis not present

## 2015-01-01 DIAGNOSIS — E039 Hypothyroidism, unspecified: Secondary | ICD-10-CM | POA: Diagnosis not present

## 2015-01-01 DIAGNOSIS — R42 Dizziness and giddiness: Secondary | ICD-10-CM | POA: Diagnosis not present

## 2015-01-01 DIAGNOSIS — R059 Cough, unspecified: Secondary | ICD-10-CM

## 2015-01-01 DIAGNOSIS — E780 Pure hypercholesterolemia: Secondary | ICD-10-CM | POA: Diagnosis not present

## 2015-01-01 DIAGNOSIS — I70219 Atherosclerosis of native arteries of extremities with intermittent claudication, unspecified extremity: Secondary | ICD-10-CM | POA: Diagnosis not present

## 2015-01-01 DIAGNOSIS — Z0000 Encounter for general adult medical examination without abnormal findings: Secondary | ICD-10-CM | POA: Diagnosis not present

## 2015-01-01 DIAGNOSIS — Z23 Encounter for immunization: Secondary | ICD-10-CM | POA: Diagnosis not present

## 2015-01-01 DIAGNOSIS — Z125 Encounter for screening for malignant neoplasm of prostate: Secondary | ICD-10-CM | POA: Diagnosis not present

## 2015-01-01 DIAGNOSIS — R5383 Other fatigue: Secondary | ICD-10-CM | POA: Diagnosis not present

## 2015-01-01 DIAGNOSIS — Z1389 Encounter for screening for other disorder: Secondary | ICD-10-CM | POA: Diagnosis not present

## 2015-01-01 DIAGNOSIS — R002 Palpitations: Secondary | ICD-10-CM | POA: Diagnosis not present

## 2015-01-01 DIAGNOSIS — Z79899 Other long term (current) drug therapy: Secondary | ICD-10-CM | POA: Diagnosis not present

## 2015-01-01 DIAGNOSIS — H9113 Presbycusis, bilateral: Secondary | ICD-10-CM | POA: Diagnosis not present

## 2015-01-09 ENCOUNTER — Ambulatory Visit (INDEPENDENT_AMBULATORY_CARE_PROVIDER_SITE_OTHER): Payer: Medicare Other | Admitting: *Deleted

## 2015-01-09 DIAGNOSIS — Z7901 Long term (current) use of anticoagulants: Secondary | ICD-10-CM | POA: Diagnosis not present

## 2015-01-09 DIAGNOSIS — I4891 Unspecified atrial fibrillation: Secondary | ICD-10-CM

## 2015-01-09 DIAGNOSIS — Z5181 Encounter for therapeutic drug level monitoring: Secondary | ICD-10-CM | POA: Diagnosis not present

## 2015-01-09 LAB — POCT INR: INR: 2.9

## 2015-02-17 ENCOUNTER — Encounter: Payer: Self-pay | Admitting: Cardiology

## 2015-02-17 ENCOUNTER — Ambulatory Visit (INDEPENDENT_AMBULATORY_CARE_PROVIDER_SITE_OTHER): Payer: Medicare Other | Admitting: Cardiology

## 2015-02-17 ENCOUNTER — Ambulatory Visit (INDEPENDENT_AMBULATORY_CARE_PROVIDER_SITE_OTHER): Payer: Medicare Other | Admitting: Pharmacist

## 2015-02-17 VITALS — BP 118/80 | HR 67 | Ht 74.0 in | Wt 220.0 lb

## 2015-02-17 DIAGNOSIS — I119 Hypertensive heart disease without heart failure: Secondary | ICD-10-CM

## 2015-02-17 DIAGNOSIS — I4891 Unspecified atrial fibrillation: Secondary | ICD-10-CM

## 2015-02-17 DIAGNOSIS — I482 Chronic atrial fibrillation, unspecified: Secondary | ICD-10-CM

## 2015-02-17 DIAGNOSIS — Z7901 Long term (current) use of anticoagulants: Secondary | ICD-10-CM | POA: Diagnosis not present

## 2015-02-17 DIAGNOSIS — Z5181 Encounter for therapeutic drug level monitoring: Secondary | ICD-10-CM

## 2015-02-17 LAB — POCT INR: INR: 2.8

## 2015-02-17 NOTE — Patient Instructions (Signed)
Medication Instructions:  Your physician recommends that you continue on your current medications as directed. Please refer to the Current Medication list given to you today.  Labwork: none  Testing/Procedures: none  Follow-Up: Your physician wants you to follow-up in: 4 month ov You will receive a reminder letter in the mail two months in advance. If you don't receive a letter, please call our office to schedule the follow-up appointment.     

## 2015-02-17 NOTE — Progress Notes (Signed)
Cardiology Office Note   Date:  02/17/2015   ID:  Mark Rivas, DOB 10-09-1927, MRN [id number]  PCP:  Lorenda PeckOBERTS, RONALD WAYNE, MD  Cardiologist: Cassell Clementhomas Jamale Spangler MD  No chief complaint on file.     History of Present Illness: Mark Rivas is a 81 y.o. male who presents for four-month follow-up office visit. This pleasant 81 year old gentleman is seen for a scheduled four-month followup office visit. He has a history of sick sinus syndrome and atrial fibrillation. He has had several cardioversions in 2010. He went on to have ablation of atrial flutter in November of 2010 but subsequently went back into atrial fibrillation and has remained asymptomatic and we have left him in atrial fib with rate control and long-term Coumadin. The patient does have normal LV function. He does have mild to moderate left atrial enlargement by echo with normal LV systolic function and he does have mild pulmonary hypertension. He has never had any symptoms of ischemic heart disease. He does have a history of mixed hyperlipidemia and this is followed by Dr. Su Hiltoberts. At his last visit he was having symptoms of transient orthostatic hypotension when he stood up.We reduced his amiodarone down to 2.5 mg daily which she is tolerating without side effects. He has not been having any chest pain or shortness of breath. He has not had any TIA or stroke symptoms. He has had a bad head cold which is partly allergies.  He has been taking Mucinex and nasal saline with improvement. He had a chest x-ray on 01/01/15 which showed stable cardiomegaly. His blood pressure has been stable at home and he has had no syncope or severe dizzy spells. 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  No angina pectoris.   Past Medical History  Diagnosis Date  . Paroxysmal atrial fibrillation   . Hypercholesterolemia   . History of anemia   . Dyslipidemia   . Allergic rhinitis   . Macular degeneration     . Borderline diabetes   . Gout     Past Surgical History  Procedure Laterality Date  . Cardiac electrophysiology study and ablation  [id number]  . Lung surgery  1977    re-tumor  . Stomach surgery  1975    re-duodenal ulcer  . Colonoscopy    . Hemorrhoid surgery       Current Outpatient Prescriptions  Medication Sig Dispense Refill  . amLODipine (NORVASC) 2.5 MG tablet Take 2.5 mg by mouth daily.    . Cholecalciferol (VITAMIN D3) 1000 UNITS CAPS Take 1 capsule by mouth daily.    [name]. doxepin (SINEQUAN) 50 MG capsule Take 50 mg by mouth daily.    [name]. loratadine (CLARITIN) 10 MG tablet Take 10 mg by mouth daily.    . meclizine (ANTIVERT) 25 MG tablet Take 25 mg by mouth 3 (three) times daily as needed for dizziness (for dizziness).    . metoprolol succinate (TOPROL-XL) 25 MG 24 hr tablet Take 25 mg by mouth daily.    . Omega-3 Fatty Acids (FISH OIL) 1000 MG CAPS Take 2,000 mg by mouth 2 (two) times daily.    [name]. omeprazole (PRILOSEC) 20 MG capsule Take 20 mg by mouth daily.    . simvastatin (ZOCOR) 20 MG tablet Take 20 mg by mouth at bedtime.    [name]. warfarin (COUMADIN) 5 MG tablet 5 mg as directed. Take by mouth as directed by the coumadin clinic     No current facility-administered  medications for this visit.    Allergies:   Review of patient's allergies indicates no active allergies.    Social History:  The patient  reports that he quit smoking about 39 years ago. He does not have any smokeless tobacco history on file.   Family History:  The patient's family history includes Heart attack in his mother; Heart disease in his father and sister.    ROS:  Please see the history of present illness.   Otherwise, review of systems are positive for none.   All other systems are reviewed and negative.    PHYSICAL EXAM: VS:  BP 118/80 mmHg  Pulse 67  Ht  (1.88 m)  Wt 220 lb (99.791 kg)  BMI 28.23 kg/m2 , BMI Body mass index is 28.23 kg/(m^2). GEN: Well nourished, well developed, in  no acute distress HEENT: normal Neck: no JVD, carotid bruits, or masses Cardiac: Irregularly irregular; no murmurs, rubs, or gallops,no edema  Respiratory:  clear to auscultation bilaterally, normal work of breathing GI: soft, nontender, nondistended, + BS MS: no deformity or atrophy Skin: warm and dry, no rash Neuro:  Strength and sensation are intact Psych: euthymic mood, full affect   EKG:  EKG is not ordered today.    Recent Labs: No results found for requested labs within last 365 days.    Lipid Panel No results found for: CHOL, TRIG, HDL, CHOLHDL, VLDL, LDLCALC, LDLDIRECT    Wt Readings from Last 3 Encounters:  02/17/15 220 lb (99.791 kg)  10/18/14 219 lb (99.338 kg)  07/01/14 219 lb (99.338 kg)        ASSESSMENT AND PLAN:  1. permanent atrial fibrillation 2. Vertigo 3. Hypertensive heart disease without heart failure 4. Prior orthostatic hypotension, resolved  Current medicines are reviewed at length with the patient today. The patient does not have concerns regarding medicines.     Current medicines are reviewed at length with the patient today.  The patient does not have concerns regarding medicines.  The following changes have been made:  no change  Labs/ tests ordered today include:  No orders of the defined types were placed in this encounter.     Plan: Continue current medication.  Recheck in 4 months for office visit  Signed, Cassell Clement MD 02/17/2015 4:42 PM    [facility] [address] [place], [place], [place]  [address] Phone: [phone]; Fax: [phone]

## 2015-04-01 ENCOUNTER — Ambulatory Visit (INDEPENDENT_AMBULATORY_CARE_PROVIDER_SITE_OTHER): Payer: Medicare Other | Admitting: Pharmacist Clinician (PhC)/ Clinical Pharmacy Specialist

## 2015-04-01 DIAGNOSIS — I482 Chronic atrial fibrillation, unspecified: Secondary | ICD-10-CM

## 2015-04-01 DIAGNOSIS — Z7901 Long term (current) use of anticoagulants: Secondary | ICD-10-CM | POA: Diagnosis not present

## 2015-04-01 DIAGNOSIS — I4891 Unspecified atrial fibrillation: Secondary | ICD-10-CM

## 2015-04-01 DIAGNOSIS — Z5181 Encounter for therapeutic drug level monitoring: Secondary | ICD-10-CM | POA: Diagnosis not present

## 2015-04-01 LAB — POCT INR: INR: 2.4

## 2015-05-13 ENCOUNTER — Ambulatory Visit (INDEPENDENT_AMBULATORY_CARE_PROVIDER_SITE_OTHER): Payer: Medicare Other | Admitting: Pharmacist

## 2015-05-13 DIAGNOSIS — Z7901 Long term (current) use of anticoagulants: Secondary | ICD-10-CM | POA: Diagnosis not present

## 2015-05-13 DIAGNOSIS — I482 Chronic atrial fibrillation, unspecified: Secondary | ICD-10-CM

## 2015-05-13 DIAGNOSIS — I4891 Unspecified atrial fibrillation: Secondary | ICD-10-CM | POA: Diagnosis not present

## 2015-05-13 DIAGNOSIS — Z5181 Encounter for therapeutic drug level monitoring: Secondary | ICD-10-CM

## 2015-05-13 LAB — POCT INR: INR: 2.6

## 2015-06-17 ENCOUNTER — Encounter: Payer: Self-pay | Admitting: Cardiology

## 2015-06-26 ENCOUNTER — Ambulatory Visit (INDEPENDENT_AMBULATORY_CARE_PROVIDER_SITE_OTHER): Payer: Medicare Other

## 2015-06-26 ENCOUNTER — Encounter: Payer: Self-pay | Admitting: Cardiology

## 2015-06-26 ENCOUNTER — Ambulatory Visit (INDEPENDENT_AMBULATORY_CARE_PROVIDER_SITE_OTHER): Payer: Medicare Other | Admitting: Cardiology

## 2015-06-26 VITALS — BP 128/64 | HR 70 | Ht 74.0 in | Wt 221.8 lb

## 2015-06-26 DIAGNOSIS — I482 Chronic atrial fibrillation, unspecified: Secondary | ICD-10-CM

## 2015-06-26 DIAGNOSIS — Z7901 Long term (current) use of anticoagulants: Secondary | ICD-10-CM | POA: Diagnosis not present

## 2015-06-26 DIAGNOSIS — I10 Essential (primary) hypertension: Secondary | ICD-10-CM | POA: Diagnosis not present

## 2015-06-26 DIAGNOSIS — I4891 Unspecified atrial fibrillation: Secondary | ICD-10-CM

## 2015-06-26 DIAGNOSIS — Z5181 Encounter for therapeutic drug level monitoring: Secondary | ICD-10-CM

## 2015-06-26 LAB — POCT INR: INR: 2.2

## 2015-06-26 NOTE — Progress Notes (Signed)
Cardiology Office Note   Date:  06/26/2015   ID:  EZELL POKE, DOB October 11, 1927, MRN [id number]  PCP:  Lorenda Peck, MD  Cardiologist: Cassell Clement MD  No chief complaint on file.     History of Present Illness: Mark Rivas is a 81 y.o. male who presents for   Mark Rivas is a 81 y.o. male who presents for four-month follow-up office visit. This pleasant 81 year old gentleman is seen for a scheduled four-month followup office visit. He has a history of sick sinus syndrome and atrial fibrillation. He has had several cardioversions in 2010. He went on to have ablation of atrial flutter in November of 2010 but subsequently went back into atrial fibrillation and has remained asymptomatic and we have left him in atrial fib with rate control and long-term Coumadin. The patient does have normal LV function. He does have mild to moderate left atrial enlargement by echo with normal LV systolic function and he does have mild pulmonary hypertension. He has never had any symptoms of ischemic heart disease. He does have a history of mixed hyperlipidemia and this is followed by Dr. Su Hilt. At his last visit he was having symptoms of transient orthostatic hypotension when he stood up.We reduced his amlodipine down to 2.5 mg daily which she is tolerating without side effects. He has not been having any chest pain or shortness of breath. He has not had any TIA or stroke symptoms.  He had a chest x-ray on 01/01/15 which showed stable cardiomegaly. His blood pressure has been stable at home and he has had no syncope or severe dizzy spells.   Past Medical History  Diagnosis Date  . Paroxysmal atrial fibrillation (HCC)   . Hypercholesterolemia   . History of anemia   . Dyslipidemia   . Allergic rhinitis   . Macular degeneration   . Borderline diabetes   . Gout     Past Surgical History  Procedure Laterality Date  . Cardiac electrophysiology study and ablation  [id number]  .  Lung surgery  1977    re-tumor  . Stomach surgery  1975    re-duodenal ulcer  . Colonoscopy    . Hemorrhoid surgery       Current Outpatient Prescriptions  Medication Sig Dispense Refill  . amLODipine (NORVASC) 2.5 MG tablet Take 2.5 mg by mouth daily.    . Cholecalciferol (VITAMIN D3) 1000 UNITS CAPS Take 1 capsule by mouth daily.    [name] doxepin (SINEQUAN) 50 MG capsule Take 50 mg by mouth daily.    [name] loratadine (CLARITIN) 10 MG tablet Take 10 mg by mouth daily.    . meclizine (ANTIVERT) 25 MG tablet Take 25 mg by mouth 3 (three) times daily as needed for dizziness (for dizziness).    . metoprolol succinate (TOPROL-XL) 25 MG 24 hr tablet Take 25 mg by mouth daily.    . Omega-3 Fatty Acids (FISH OIL) 1000 MG CAPS Take 2,000 mg by mouth 2 (two) times daily.    [name] omeprazole (PRILOSEC) 20 MG capsule Take 20 mg by mouth daily.    . simvastatin (ZOCOR) 20 MG tablet Take 20 mg by mouth at bedtime.    [name] warfarin (COUMADIN) 5 MG tablet 5 mg as directed. Take by mouth as directed by the coumadin clinic     No current facility-administered medications for this visit.    Allergies:   Review of patient's allergies indicates no active allergies.    Social History:  The  patient  reports that he quit smoking about 39 years ago. He does not have any smokeless tobacco history on file.   Family History:  The patient's family history includes Heart attack in his mother; Heart disease in his father and sister.    ROS:  Please see the history of present illness.   Otherwise, review of systems are positive for none.   All other systems are reviewed and negative.    PHYSICAL EXAM: VS:  BP 128/64 mmHg  Pulse 70  Ht 6\' 2"  (1.88 m)  Wt 221 lb 12.8 oz (100.608 kg)  BMI 28.47 kg/m2 , BMI Body mass index is 28.47 kg/(m^2). GEN: Well nourished, well developed, in no acute distress HEENT: normal Neck: no JVD, carotid bruits, or masses Cardiac: Irregularly irregular no murmurs, rubs, or gallops,no edema    Respiratory:  clear to auscultation bilaterally, normal work of breathing GI: soft, nontender, nondistended, + BS MS: no deformity or atrophy Skin: warm and dry, no rash Neuro:  Strength and sensation are intact Psych: euthymic mood, full affect   EKG:  EKG is not ordered today.    Recent Labs: No results found for requested labs within last 365 days.    Lipid Panel No results found for: CHOL, TRIG, HDL, CHOLHDL, VLDL, LDLCALC, LDLDIRECT    Wt Readings from Last 3 Encounters:  06/26/15 221 lb 12.8 oz (100.608 kg)  02/17/15 220 lb (99.791 kg)  10/18/14 219 lb (99.338 kg)         ASSESSMENT AND PLAN:  1. permanent atrial fibrillation 2. Vertigo 3. Hypertensive heart disease without heart failure 4. Prior orthostatic hypotension, resolved   Current medicines are reviewed at length with the patient today.  The patient does not have concerns regarding medicines.  The following changes have been made:  no change  Labs/ tests ordered today include:  No orders of the defined types were placed in this encounter.    Disposition: Continue current medication.  Recheck in 4 months for office visit and EKG  Signed, Cassell Clementhomas Arnecia Ector MD 06/26/2015 5:29 PM    [facility] [address], [place], [place]  [address] Phone: [phone]; Fax: [phone]

## 2015-06-26 NOTE — Patient Instructions (Signed)
Medication Instructions:  Your physician recommends that you continue on your current medications as directed. Please refer to the Current Medication list given to you today.  Labwork: none  Testing/Procedures: none  Follow-Up: Your physician recommends that you schedule a follow-up appointment in: 4 month ov/ekg with Lori G NP or Scott W PA     

## 2015-07-02 ENCOUNTER — Other Ambulatory Visit: Payer: Self-pay | Admitting: Cardiology

## 2015-08-14 ENCOUNTER — Ambulatory Visit (INDEPENDENT_AMBULATORY_CARE_PROVIDER_SITE_OTHER): Payer: Medicare Other | Admitting: *Deleted

## 2015-08-14 DIAGNOSIS — I482 Chronic atrial fibrillation, unspecified: Secondary | ICD-10-CM

## 2015-08-14 DIAGNOSIS — Z5181 Encounter for therapeutic drug level monitoring: Secondary | ICD-10-CM

## 2015-08-14 DIAGNOSIS — I4891 Unspecified atrial fibrillation: Secondary | ICD-10-CM

## 2015-08-14 DIAGNOSIS — Z7901 Long term (current) use of anticoagulants: Secondary | ICD-10-CM | POA: Diagnosis not present

## 2015-08-14 LAB — POCT INR: INR: 2.9

## 2015-09-25 ENCOUNTER — Ambulatory Visit (INDEPENDENT_AMBULATORY_CARE_PROVIDER_SITE_OTHER): Payer: Medicare Other | Admitting: Pharmacist

## 2015-09-25 DIAGNOSIS — I482 Chronic atrial fibrillation, unspecified: Secondary | ICD-10-CM

## 2015-09-25 DIAGNOSIS — Z5181 Encounter for therapeutic drug level monitoring: Secondary | ICD-10-CM | POA: Diagnosis not present

## 2015-09-25 DIAGNOSIS — I4891 Unspecified atrial fibrillation: Secondary | ICD-10-CM

## 2015-09-25 DIAGNOSIS — Z7901 Long term (current) use of anticoagulants: Secondary | ICD-10-CM

## 2015-09-25 LAB — POCT INR: INR: 2.7

## 2015-10-26 NOTE — Progress Notes (Signed)
Cardiology Office Note:    Date:  10/27/2015   ID:  Mark Rivas, DOB Oct 22, 1927, MRN [id number]  PCP:  Mark Peck, MD  Cardiologist:  Previously Dr. Cassell Rivas  [facility], PA-C) Electrophysiologist:  n/a  Chief Complaint  Patient presents with  . Atrial Fibrillation    Follow up     History of Present Illness:     Mark Rivas is a 82 y.o. male with a hx of SSS, PAF, HL.  He had several DCCVs in 2010 and eventual ablation of AFlutter in 07/2009.  He subsequently went into AFib.  He has been managed with a rate control strategy since.  He is on Coumadin for anticoagulation.  He has a hx of orthostatic hypotension and Amlodipine was reduced in the past.  Last seen by Dr. Cassell Rivas 10/16 with recommendations to FU with me or Mark Fredrickson, NP (in light of his retirement).    Returns for FU.  Here with his wife Mark Rivas.  He is doing well. Denies any chest pain or significant dyspnea.  Denies syncope, orthopnea, PND, edema.  No bleeding issues.     Past Medical History  Diagnosis Date  . Paroxysmal atrial fibrillation (HCC)   . Hypercholesterolemia   . History of anemia   . Dyslipidemia   . Allergic rhinitis   . Macular degeneration   . Borderline diabetes   . Gout     Past Surgical History  Procedure Laterality Date  . Cardiac electrophysiology study and ablation  [id number]  . Lung surgery  1977    re-tumor  . Stomach surgery  1975    re-duodenal ulcer  . Colonoscopy    . Hemorrhoid surgery      Current Medications: Outpatient Prescriptions Prior to Visit  Medication Sig Dispense Refill  . amLODipine (NORVASC) 2.5 MG tablet Take 2.5 mg by mouth daily.    . Cholecalciferol (VITAMIN D3) 1000 UNITS CAPS Take 1 capsule by mouth daily.    [name] doxepin (SINEQUAN) 50 MG capsule Take 50 mg by mouth daily.    [name] loratadine (CLARITIN) 10 MG tablet Take 10 mg by mouth daily.    . meclizine (ANTIVERT) 25 MG tablet Take 25 mg by mouth 3 (three) times  daily as needed for dizziness (for dizziness).    . metoprolol succinate (TOPROL-XL) 25 MG 24 hr tablet Take 25 mg by mouth daily.    . Omega-3 Fatty Acids (FISH OIL) 1000 MG CAPS Take 2,000 mg by mouth 2 (two) times daily.    [name] omeprazole (PRILOSEC) 20 MG capsule Take 20 mg by mouth daily.    . simvastatin (ZOCOR) 20 MG tablet Take 20 mg by mouth at bedtime.    [name] warfarin (COUMADIN) 5 MG tablet TAKE AS DIRECTED BY ANTICOAGULATION CLINIC 120 tablet 1   No facility-administered medications prior to visit.     Allergies:   Review of patient's allergies indicates no active allergies.   Social History   Social History  . Marital Status: Married    Spouse Name: N/A  . Number of Children: N/A  . Years of Education: N/A   Social History Main Topics  . Smoking status: Former Smoker    Quit date: 09/14/1975  . Smokeless tobacco: None  . Alcohol Use: None  . Drug Use: None  . Sexual Activity: Not Asked   Other Topics Concern  . None   Social History Narrative     Family History:  The patient's family history  includes Heart attack in his mother; Heart disease in his father and sister.   ROS:   Please see the history of present illness.    Review of Systems  HENT:       + Sneezing and post nasal drip  Cardiovascular: Positive for irregular heartbeat.  Respiratory: Positive for snoring.   All other systems reviewed and are negative.   Physical Exam:    VS:  BP 136/70 mmHg  Pulse 80  Ht  (1.88 m)  Wt 218 lb 12.8 oz (99.247 kg)  BMI 28.08 kg/m2   GEN: Well nourished, well developed, in no acute distress HEENT: normal Neck: no JVD, no masses Cardiac: Normal S1/S2, irreg irreg rhythm; no murmurs,   no edema;  no carotid bruits,   Respiratory:  clear to auscultation bilaterally; no wheezing, rhonchi or rales GI: soft, nontender  MS: no deformity or atrophy Skin: warm and dry, no rash Neuro:    no focal deficits  Psych: Alert and oriented x 3, normal affect  Wt  Readings from Last 3 Encounters:  10/27/15 218 lb 12.8 oz (99.247 kg)  06/26/15 221 lb 12.8 oz (100.608 kg)  02/17/15 220 lb (99.791 kg)      Studies/Labs Reviewed:     EKG:  EKG is  ordered today.  The ekg ordered today demonstrates AFib, HR 80, no change from prior tracing  Recent Labs: No results found for requested labs within last 365 days.   Recent Lipid Panel No results found for: CHOL, TRIG, HDL, CHOLHDL, VLDL, LDLCALC, LDLDIRECT  Additional studies/ records that were reviewed today include:   Echo 4/10 Mild to mod LVH, EF 55-60%, normal diastolic function, mild to mod LAE, mild Ao sclerosis, trivial AI, mild MR, mild TR, mild Pulmo HTN (RVSP 44 mmHg)  ASSESSMENT:     1. Chronic atrial fibrillation (HCC)   2. Benign hypertensive heart disease without heart failure     PLAN:     In order of problems listed above:  1. Chronic AFib - Rate controlled.  Tolerating Coumadin.  He is followed in the Coumadin clinic in our office. Denies any bleeding issues.  Continue Toprol, Coumadin. FU me in 6 mos.   2. HTN - BP controlled.  Continue current Rx.     Medication Adjustments/Labs and Tests Ordered: Current medicines are reviewed at length with the patient today.  Concerns regarding medicines are outlined above.  Medication changes, Labs and Tests ordered today are outlined in the Patient Instructions noted below. Patient Instructions  Medication Instructions:  Your physician recommends that you continue on your current medications as directed. Please refer to the Current Medication list given to you today.  Labwork: NONE  Testing/Procedures: NONE  Follow-Up: Your physician wants you to follow-up in: 6 MONTHS WITH Mark Rivas, [facility]  You will receive a reminder letter in the mail two months in advance. If you don't receive a letter, please call our office to schedule the follow-up appointment.  Any Other Special Instructions Will Be Listed Below (If  Applicable).  If you need a refill on your cardiac medications before your next appointment, please call your pharmacy.    Signed, Mark Newcomer, PA-C  10/27/2015 2:57 PM    [facility] [address] [place], [place], [place]  [address] Phone: [phone]; Fax: [phone]

## 2015-10-27 ENCOUNTER — Ambulatory Visit (INDEPENDENT_AMBULATORY_CARE_PROVIDER_SITE_OTHER): Payer: Medicare Other | Admitting: Pharmacist

## 2015-10-27 ENCOUNTER — Encounter: Payer: Self-pay | Admitting: Physician Assistant

## 2015-10-27 ENCOUNTER — Ambulatory Visit (INDEPENDENT_AMBULATORY_CARE_PROVIDER_SITE_OTHER): Payer: Medicare Other | Admitting: Physician Assistant

## 2015-10-27 VITALS — BP 136/70 | HR 80 | Ht 74.0 in | Wt 218.8 lb

## 2015-10-27 DIAGNOSIS — I119 Hypertensive heart disease without heart failure: Secondary | ICD-10-CM | POA: Diagnosis not present

## 2015-10-27 DIAGNOSIS — I482 Chronic atrial fibrillation, unspecified: Secondary | ICD-10-CM

## 2015-10-27 DIAGNOSIS — Z7901 Long term (current) use of anticoagulants: Secondary | ICD-10-CM

## 2015-10-27 DIAGNOSIS — I4891 Unspecified atrial fibrillation: Secondary | ICD-10-CM | POA: Diagnosis not present

## 2015-10-27 DIAGNOSIS — Z5181 Encounter for therapeutic drug level monitoring: Secondary | ICD-10-CM

## 2015-10-27 LAB — POCT INR: INR: 2.6

## 2015-10-27 NOTE — Patient Instructions (Addendum)
Medication Instructions:  Your physician recommends that you continue on your current medications as directed. Please refer to the Current Medication list given to you today.  Labwork: NONE  Testing/Procedures: NONE  Follow-Up: Your physician wants you to follow-up in: 6 MONTHS WITH SCOTT WEAVER, PAC  You will receive a reminder letter in the mail two months in advance. If you don't receive a letter, please call our office to schedule the follow-up appointment.  Any Other Special Instructions Will Be Listed Below (If Applicable).  If you need a refill on your cardiac medications before your next appointment, please call your pharmacy. 

## 2015-12-11 ENCOUNTER — Ambulatory Visit (INDEPENDENT_AMBULATORY_CARE_PROVIDER_SITE_OTHER): Payer: Medicare Other | Admitting: *Deleted

## 2015-12-11 DIAGNOSIS — I482 Chronic atrial fibrillation, unspecified: Secondary | ICD-10-CM

## 2015-12-11 DIAGNOSIS — I4891 Unspecified atrial fibrillation: Secondary | ICD-10-CM | POA: Diagnosis not present

## 2015-12-11 DIAGNOSIS — Z7901 Long term (current) use of anticoagulants: Secondary | ICD-10-CM | POA: Diagnosis not present

## 2015-12-11 DIAGNOSIS — Z5181 Encounter for therapeutic drug level monitoring: Secondary | ICD-10-CM

## 2015-12-11 LAB — POCT INR: INR: 2.8

## 2016-01-13 ENCOUNTER — Other Ambulatory Visit: Payer: Self-pay | Admitting: *Deleted

## 2016-01-13 MED ORDER — WARFARIN SODIUM 5 MG PO TABS: ORAL_TABLET | ORAL | Status: DC

## 2016-01-22 ENCOUNTER — Ambulatory Visit (INDEPENDENT_AMBULATORY_CARE_PROVIDER_SITE_OTHER): Payer: Medicare Other | Admitting: *Deleted

## 2016-01-22 DIAGNOSIS — Z7901 Long term (current) use of anticoagulants: Secondary | ICD-10-CM | POA: Diagnosis not present

## 2016-01-22 DIAGNOSIS — Z5181 Encounter for therapeutic drug level monitoring: Secondary | ICD-10-CM | POA: Diagnosis not present

## 2016-01-22 DIAGNOSIS — I4891 Unspecified atrial fibrillation: Secondary | ICD-10-CM | POA: Diagnosis not present

## 2016-01-22 DIAGNOSIS — I482 Chronic atrial fibrillation, unspecified: Secondary | ICD-10-CM

## 2016-01-22 LAB — POCT INR: INR: 2.5

## 2016-03-04 ENCOUNTER — Ambulatory Visit (INDEPENDENT_AMBULATORY_CARE_PROVIDER_SITE_OTHER): Payer: Medicare Other | Admitting: *Deleted

## 2016-03-04 DIAGNOSIS — I482 Chronic atrial fibrillation, unspecified: Secondary | ICD-10-CM

## 2016-03-04 DIAGNOSIS — Z7901 Long term (current) use of anticoagulants: Secondary | ICD-10-CM

## 2016-03-04 DIAGNOSIS — I4891 Unspecified atrial fibrillation: Secondary | ICD-10-CM | POA: Diagnosis not present

## 2016-03-04 DIAGNOSIS — Z5181 Encounter for therapeutic drug level monitoring: Secondary | ICD-10-CM

## 2016-03-04 LAB — POCT INR: INR: 2.6

## 2016-04-15 ENCOUNTER — Ambulatory Visit (INDEPENDENT_AMBULATORY_CARE_PROVIDER_SITE_OTHER): Payer: Medicare Other | Admitting: *Deleted

## 2016-04-15 ENCOUNTER — Encounter (INDEPENDENT_AMBULATORY_CARE_PROVIDER_SITE_OTHER): Payer: Self-pay

## 2016-04-15 DIAGNOSIS — Z7901 Long term (current) use of anticoagulants: Secondary | ICD-10-CM

## 2016-04-15 DIAGNOSIS — I4891 Unspecified atrial fibrillation: Secondary | ICD-10-CM | POA: Diagnosis not present

## 2016-04-15 DIAGNOSIS — Z5181 Encounter for therapeutic drug level monitoring: Secondary | ICD-10-CM

## 2016-04-15 LAB — POCT INR: INR: 2.5

## 2016-05-02 NOTE — Progress Notes (Signed)
Cardiology Office Note:    Date:  05/03/2016   ID:  Mark Rivas, DOB [birth year], MRN [id number]  PCP:  Mark Rivas  Cardiologist:  Previously Dr. Cassell Clementhomas Rivas  [facility](Mark Grumbine, PA-C)  Electrophysiologist:  n/a  Referring Rivas: Mark Rivas, Ronald, Rivas   Chief Complaint  Patient presents with  . Follow-up    Atrial Fib, HTN   History of Present Illness:    Mark Rivas is a 82 y.o. male with a hx of SSS, PAF, HL.  He had several DCCVs in 2010 and eventual ablation of AFlutter in 07/2009.  He subsequently went into AFib.  He has been managed with a rate control strategy since.  He is on Coumadin for anticoagulation.  He has a hx of orthostatic hypotension and Amlodipine was reduced in the past. Last seen by me in 2/17.  Returns for routine FU.  Here with his wife Mark Rivas.  His daughter was dx with pancreatic CA and underwent surgery.  Unfortunately, his wife was dx with breast and lung CA and has also undergone surgery and radiation.  She is doing better now.  He is somewhat anxious about everything.  He denies chest pain, dyspnea, orthopnea, PND, edema, syncope.  He has a lot of allergy issues and takes Claritin.    Prior CV studies that were reviewed today include:    Echo 4/10 Mild to mod LVH, EF 55-60%, normal diastolic function, mild to mod LAE, mild Ao sclerosis, trivial AI, mild MR, mild TR, mild Pulmo HTN (RVSP 44 mmHg)  Past Medical History:  Diagnosis Date  . Allergic rhinitis   . Borderline diabetes   . Chronic atrial fibrillation (HCC)   . Dyslipidemia   . Gout   . History of anemia   . Hypercholesterolemia   . Macular degeneration   . PUD (peptic ulcer disease)    hx of pyloroplasty     Past Surgical History:  Procedure Laterality Date  . CARDIAC ELECTROPHYSIOLOGY STUDY AND ABLATION  [id number]  . COLONOSCOPY    . HEMORRHOID SURGERY    . LUNG SURGERY  1977   re-tumor  . STOMACH SURGERY  1975   re-duodenal ulcer    Current Medications: Outpatient  Medications Prior to Visit  Medication Sig Dispense Refill  . amLODipine (NORVASC) 2.5 MG tablet Take 2.5 mg by mouth daily.    . Cholecalciferol (VITAMIN D3) 1000 UNITS CAPS Take 1 capsule by mouth daily.    [name]. doxepin (SINEQUAN) 50 MG capsule Take 50 mg by mouth daily.    [name]. loratadine (CLARITIN) 10 MG tablet Take 10 mg by mouth daily.    . meclizine (ANTIVERT) 25 MG tablet Take 25 mg by mouth 3 (three) times daily as needed for dizziness (for dizziness).    . metoprolol succinate (TOPROL-XL) 25 MG 24 hr tablet Take 25 mg by mouth daily.    . Omega-3 Fatty Acids (FISH OIL) 1000 MG CAPS Take 2,000 mg by mouth 2 (two) times daily.    [name]. omeprazole (PRILOSEC) 20 MG capsule Take 20 mg by mouth daily.    . simvastatin (ZOCOR) 20 MG tablet Take 20 mg by mouth at bedtime.    [name]. warfarin (COUMADIN) 5 MG tablet TAKE AS DIRECTED BY ANTICOAGULATION CLINIC 120 tablet 1   No facility-administered medications prior to visit.       Allergies:   Review of patient's allergies indicates no active allergies.   Social History   Social History  . Marital status: Married  Spouse name: N/A  . Number of children: N/A  . Years of education: N/A   Social History Main Topics  . Smoking status: Former Smoker    Quit date: 09/14/1975  . Smokeless tobacco: Former NeurosurgeonUser  . Alcohol use None  . Drug use: Unknown  . Sexual activity: Not Asked   Other Topics Concern  . None   Social History Narrative  . None     Family History:  The patient's family history includes Heart attack in his mother; Heart disease in his father and sister.   ROS:   Please see the history of present illness.    ROS All other systems reviewed and are negative.   EKGs/Labs/Other Test Reviewed:    EKG:  EKG is  ordered today.  The ekg ordered today demonstrates AFib, HR 78, no change since prior tracing  Recent Labs: No results found for requested labs within last 8760 hours.   Recent Lipid Panel No results found for: CHOL,  TRIG, HDL, CHOLHDL, VLDL, LDLCALC, LDLDIRECT   Physical Exam:    VS:  BP (!) 150/80   Pulse 78   Ht 6\' 2"  (1.88 m)   Wt 215 lb (97.5 kg)   BMI 27.60 kg/m     Wt Readings from Last 3 Encounters:  05/03/16 215 lb (97.5 kg)  10/27/15 218 lb 12.8 oz (99.2 kg)  06/26/15 221 lb 12.8 oz (100.6 kg)     Physical Exam  Constitutional: He is oriented to person, place, and time. He appears well-developed and well-nourished. No distress.  HENT:  Head: Normocephalic and atraumatic.  Eyes: No scleral icterus.  Neck: Normal range of motion. No JVD present.  Cardiovascular: Normal rate, S1 normal and S2 normal.  An irregularly irregular rhythm present.  No murmur heard. Pulmonary/Chest: Effort normal and breath sounds normal. He has no wheezes. He has no rhonchi. He has no rales.  Abdominal: Soft. There is no tenderness.  Musculoskeletal: He exhibits no edema.  Neurological: He is alert and oriented to person, place, and time.  Skin: Skin is warm and dry.  Psychiatric: He has a normal mood and affect.    ASSESSMENT:    1. Chronic atrial fibrillation (HCC)   2. Essential hypertension, benign    PLAN:    In order of problems listed above:  1. Chronic AFib - Rate is controlled.  Continue Toprol, Coumadin. FU me in 6 mos.   2. HTN - BP a little high today but still at target according to current JNC guidelines.  Continue to monitor.     Medication Adjustments/Labs and Tests Ordered: Current medicines are reviewed at length with the patient today.  Concerns regarding medicines are outlined above.  Medication changes, Labs and Tests ordered today are outlined in the Patient Instructions noted below. Patient Instructions  Medication Instructions:  Your physician recommends that you continue on your current medications as directed. Please refer to the Current Medication list given to you today. Labwork: NONE Testing/Procedures: NONE Follow-Up: Your physician wants you to follow-up  in: 6 MONTHS [facility]  You will receive a reminder letter in the mail two months in advance. If you don't receive a letter, please call our office to schedule the follow-up appointment. Any Other Special Instructions Will Be Listed Below (If Applicable). If you need a refill on your cardiac medications before your next appointment, please call your pharmacy.  Signed, Tereso NewcomerScott Kailena Lubas, PA-C  05/03/2016 4:12 PM    [facility]  [address], [place], [place]  [address] Phone: [phone]; Fax: [phone]

## 2016-05-03 ENCOUNTER — Ambulatory Visit: Payer: Medicare Other | Admitting: Physician Assistant

## 2016-05-03 ENCOUNTER — Encounter: Payer: Self-pay | Admitting: Physician Assistant

## 2016-05-03 ENCOUNTER — Encounter (INDEPENDENT_AMBULATORY_CARE_PROVIDER_SITE_OTHER): Payer: Self-pay

## 2016-05-03 ENCOUNTER — Ambulatory Visit (INDEPENDENT_AMBULATORY_CARE_PROVIDER_SITE_OTHER): Payer: Medicare Other | Admitting: Physician Assistant

## 2016-05-03 VITALS — BP 150/80 | HR 78 | Ht 74.0 in | Wt 215.0 lb

## 2016-05-03 DIAGNOSIS — I482 Chronic atrial fibrillation, unspecified: Secondary | ICD-10-CM

## 2016-05-03 DIAGNOSIS — I10 Essential (primary) hypertension: Secondary | ICD-10-CM

## 2016-05-03 NOTE — Patient Instructions (Addendum)
Medication Instructions:  Your physician recommends that you continue on your current medications as directed. Please refer to the Current Medication list given to you today.  Labwork: NONE  Testing/Procedures: NONE  Follow-Up: Your physician wants you to follow-up in: 6 MONTHS WITH SCOTT WEAVER, PAC  You will receive a reminder letter in the mail two months in advance. If you don't receive a letter, please call our office to schedule the follow-up appointment.  Any Other Special Instructions Will Be Listed Below (If Applicable).  If you need a refill on your cardiac medications before your next appointment, please call your pharmacy. 

## 2016-05-27 ENCOUNTER — Ambulatory Visit (INDEPENDENT_AMBULATORY_CARE_PROVIDER_SITE_OTHER): Payer: Medicare Other | Admitting: Pharmacist

## 2016-05-27 DIAGNOSIS — I4891 Unspecified atrial fibrillation: Secondary | ICD-10-CM | POA: Diagnosis not present

## 2016-05-27 DIAGNOSIS — Z5181 Encounter for therapeutic drug level monitoring: Secondary | ICD-10-CM

## 2016-05-27 DIAGNOSIS — Z7901 Long term (current) use of anticoagulants: Secondary | ICD-10-CM

## 2016-05-27 LAB — POCT INR: INR: 2.5

## 2016-07-08 ENCOUNTER — Ambulatory Visit (INDEPENDENT_AMBULATORY_CARE_PROVIDER_SITE_OTHER): Payer: Medicare Other | Admitting: Pharmacist

## 2016-07-08 DIAGNOSIS — I4891 Unspecified atrial fibrillation: Secondary | ICD-10-CM | POA: Diagnosis not present

## 2016-07-08 DIAGNOSIS — Z5181 Encounter for therapeutic drug level monitoring: Secondary | ICD-10-CM | POA: Diagnosis not present

## 2016-07-08 DIAGNOSIS — Z7901 Long term (current) use of anticoagulants: Secondary | ICD-10-CM

## 2016-07-08 LAB — POCT INR: INR: 2.1

## 2016-08-08 ENCOUNTER — Other Ambulatory Visit: Payer: Self-pay | Admitting: Cardiology

## 2016-08-19 ENCOUNTER — Ambulatory Visit (INDEPENDENT_AMBULATORY_CARE_PROVIDER_SITE_OTHER): Payer: Medicare Other | Admitting: Pharmacist

## 2016-08-19 DIAGNOSIS — I4891 Unspecified atrial fibrillation: Secondary | ICD-10-CM | POA: Diagnosis not present

## 2016-08-19 DIAGNOSIS — Z5181 Encounter for therapeutic drug level monitoring: Secondary | ICD-10-CM | POA: Diagnosis not present

## 2016-08-19 DIAGNOSIS — Z7901 Long term (current) use of anticoagulants: Secondary | ICD-10-CM | POA: Diagnosis not present

## 2016-08-19 LAB — POCT INR: INR: 2.6

## 2016-10-01 ENCOUNTER — Ambulatory Visit (INDEPENDENT_AMBULATORY_CARE_PROVIDER_SITE_OTHER): Payer: Medicare Other | Admitting: Pharmacist

## 2016-10-01 DIAGNOSIS — Z7901 Long term (current) use of anticoagulants: Secondary | ICD-10-CM | POA: Diagnosis not present

## 2016-10-01 DIAGNOSIS — Z5181 Encounter for therapeutic drug level monitoring: Secondary | ICD-10-CM

## 2016-10-01 DIAGNOSIS — I4891 Unspecified atrial fibrillation: Secondary | ICD-10-CM | POA: Diagnosis not present

## 2016-10-01 LAB — POCT INR: INR: 2.6

## 2016-11-11 ENCOUNTER — Ambulatory Visit (INDEPENDENT_AMBULATORY_CARE_PROVIDER_SITE_OTHER): Payer: Medicare Other | Admitting: *Deleted

## 2016-11-11 DIAGNOSIS — Z7901 Long term (current) use of anticoagulants: Secondary | ICD-10-CM | POA: Diagnosis not present

## 2016-11-11 DIAGNOSIS — Z5181 Encounter for therapeutic drug level monitoring: Secondary | ICD-10-CM | POA: Diagnosis not present

## 2016-11-11 DIAGNOSIS — I4891 Unspecified atrial fibrillation: Secondary | ICD-10-CM

## 2016-11-11 LAB — POCT INR: INR: 2.6

## 2016-11-23 ENCOUNTER — Ambulatory Visit (INDEPENDENT_AMBULATORY_CARE_PROVIDER_SITE_OTHER): Payer: Medicare Other | Admitting: Physician Assistant

## 2016-11-23 ENCOUNTER — Encounter: Payer: Self-pay | Admitting: Physician Assistant

## 2016-11-23 VITALS — BP 132/84 | HR 80 | Ht 74.0 in | Wt 216.4 lb

## 2016-11-23 DIAGNOSIS — I482 Chronic atrial fibrillation: Secondary | ICD-10-CM | POA: Diagnosis not present

## 2016-11-23 DIAGNOSIS — I119 Hypertensive heart disease without heart failure: Secondary | ICD-10-CM

## 2016-11-23 DIAGNOSIS — I4821 Permanent atrial fibrillation: Secondary | ICD-10-CM

## 2016-11-23 NOTE — Progress Notes (Signed)
Cardiology Office Note:    Date:  11/23/2016   ID:  Mark SmallBilly D Ribas, DOB 1928/03/22, MRN [id number]  PCP:  Lorenda PeckOBERTS, RONALD WAYNE, MD  Cardiologist:  Previously Dr. Cassell Clementhomas [facility](Abigail Teall, PA-C)  Electrophysiologist:  n/a  Referring MD: Burton Apleyoberts, Ronald, MD   Chief Complaint  Patient presents with  . Follow-up    Atrial fibrillation, hypertension    History of Present Illness:    Mark Rivas is a 83 y.o. male with a hx of SSS, PAF, HL. He had several DCCVs in 2010 and eventual ablation of AFlutter in 07/2009. He subsequently went into AFib. He has been managed with a rate control strategy since. He is on Coumadin for anticoagulation. He has a hx of orthostatic hypotension and Amlodipine was reduced in the past. Last seen in 8/17.  His daughter was undergoing tx for pancreatic CA and his wife was undergoing tx for breast and lung CA.   He returns for follow-up. He is here today with his wife. He is overall doing well. Since last seen, he denies chest pain, shortness of breath, syncope, orthopnea, PND or significant pedal edema.  He has a hx of vertigo.  He has had recurrent dizziness recently.  He takes Meclizine prn.  I suggested he talk to his PCP about a referral to vestibular rehab.     Prior CV studies:   The following studies were reviewed today:  Echo 4/10 Mild to mod LVH, EF 55-60%, normal diastolic function, mild to mod LAE, mild Ao sclerosis, trivial AI, mild MR, mild TR, mild Pulmo HTN (RVSP 44 mmHg)  Past Medical History:  Diagnosis Date  . Allergic rhinitis   . Borderline diabetes   . Chronic atrial fibrillation (HCC)   . Dyslipidemia   . Gout   . History of anemia   . Hypercholesterolemia   . Macular degeneration   . PUD (peptic ulcer disease)    hx of pyloroplasty     Past Surgical History:  Procedure Laterality Date  . CARDIAC ELECTROPHYSIOLOGY STUDY AND ABLATION  [id number]  . COLONOSCOPY    . HEMORRHOID SURGERY    . LUNG SURGERY  1977   re-tumor  . STOMACH SURGERY  1975   re-duodenal ulcer    Current Medications: Current Meds  Medication Sig  . amLODipine (NORVASC) 2.5 MG tablet Take 2.5 mg by mouth daily.  . Cholecalciferol (VITAMIN D3) 1000 UNITS CAPS Take 1 capsule by mouth daily.  [name]. doxepin (SINEQUAN) 50 MG capsule Take 50 mg by mouth daily.  [name]. loratadine (CLARITIN) 10 MG tablet Take 10 mg by mouth daily.  . meclizine (ANTIVERT) 25 MG tablet Take 25 mg by mouth 3 (three) times daily as needed for dizziness (for dizziness).  . metoprolol succinate (TOPROL-XL) 25 MG 24 hr tablet Take 25 mg by mouth daily.  . Omega-3 Fatty Acids (FISH OIL) 1000 MG CAPS Take 2,000 mg by mouth 2 (two) times daily.  [name]. omeprazole (PRILOSEC) 20 MG capsule Take 20 mg by mouth daily.  . simvastatin (ZOCOR) 20 MG tablet Take 20 mg by mouth at bedtime.  [name]. warfarin (COUMADIN) 5 MG tablet TAKE AS DIRECTED BY ANTICOAGULATION CLINIC     Allergies:   Patient has no active allergies.   Social History   Social History  . Marital status: Married    Spouse name: N/A  . Number of children: N/A  . Years of education: N/A   Social History Main Topics  . Smoking status: Former Smoker  Quit date: 09/14/1975  . Smokeless tobacco: Former Neurosurgeon  . Alcohol use None  . Drug use: Unknown  . Sexual activity: Not Asked   Other Topics Concern  . None   Social History Narrative  . None     Family History  Problem Relation Age of Onset  . Heart attack Mother   . Heart disease Father   . Heart disease Sister      ROS:   Please see the history of present illness.    ROS All other systems reviewed and are negative.   EKGs/Labs/Other Test Reviewed:    EKG:  EKG is not ordered today.  The ekg ordered today demonstrates n/a  Recent Labs: No results found for requested labs within last 8760 hours.   Recent Lipid Panel No results found for: CHOL, TRIG, HDL, CHOLHDL, VLDL, LDLCALC, LDLDIRECT   Physical Exam:    VS:  BP 132/84   Pulse 80    Ht 6\' 2"  (1.88 m)   Wt 216 lb 6.4 oz (98.2 kg)   BMI 27.78 kg/m     Wt Readings from Last 3 Encounters:  11/23/16 216 lb 6.4 oz (98.2 kg)  05/03/16 215 lb (97.5 kg)  10/27/15 218 lb 12.8 oz (99.2 kg)     Physical Exam  Constitutional: He is oriented to person, place, and time. He appears well-developed and well-nourished. No distress.  HENT:  Head: Normocephalic and atraumatic.  Eyes: No scleral icterus.  Neck: Normal range of motion. No JVD present.  Cardiovascular: Normal rate, S1 normal and S2 normal.  An irregularly irregular rhythm present.  No murmur heard. Pulmonary/Chest: Effort normal and breath sounds normal. He has no wheezes. He has no rhonchi. He has no rales.  Abdominal: Soft. There is no tenderness.  Musculoskeletal: He exhibits no edema.  Neurological: He is alert and oriented to person, place, and time.  Skin: Skin is warm and dry.  Psychiatric: He has a normal mood and affect.    ASSESSMENT:    1. Permanent atrial fibrillation (HCC)   2. Benign hypertensive heart disease without heart failure    PLAN:    In order of problems listed above:  1. Permanent atrial fibrillation (HCC) -  Rate is well controlled. He is tolerating Coumadin. Continue current therapy.  2. Benign hypertensive heart disease without heart failure -  Blood pressure well controlled. Continue current therapy.  Dispo:  Return in about 1 year (around 11/23/2017) for Routine Follow Up, w/ Tereso Newcomer, PA-C.    Medication Adjustments/Labs and Tests Ordered: Current medicines are reviewed at length with the patient today.  Concerns regarding medicines are outlined above.  Medication changes, Labs and Tests ordered today are outlined in the Patient Instructions noted below. Patient Instructions  Medication Instructions:  None ordered   Labwork: None ordered  Testing/Procedures: None ordered  Follow-Up: Your physician recommends that you schedule a follow-up appointment in:  please follow up in one year with Tereso Newcomer PA-C  Any Other Special Instructions Will Be Listed Below (If Applicable).  If you need a refill on your cardiac medications before your next appointment, please call your pharmacy.  Signed, Tereso Newcomer, PA-C  11/23/2016 4:21 PM    [facility] [address] [place], [place], [place]  [address] Phone: [phone]; Fax: [phone]

## 2016-11-23 NOTE — Patient Instructions (Addendum)
Medication Instructions:  None ordered   Labwork: None ordered  Testing/Procedures: None ordered  Follow-Up: Your physician recommends that you schedule a follow-up appointment in: please follow up in one year with Tereso NewcomerScott Weaver PA-C  Any Other Special Instructions Will Be Listed Below (If Applicable).  If you need a refill on your cardiac medications before your next appointment, please call your pharmacy.

## 2016-12-23 ENCOUNTER — Ambulatory Visit (INDEPENDENT_AMBULATORY_CARE_PROVIDER_SITE_OTHER): Payer: Medicare Other | Admitting: Pharmacist

## 2016-12-23 DIAGNOSIS — I482 Chronic atrial fibrillation: Secondary | ICD-10-CM

## 2016-12-23 DIAGNOSIS — Z5181 Encounter for therapeutic drug level monitoring: Secondary | ICD-10-CM

## 2016-12-23 DIAGNOSIS — Z7901 Long term (current) use of anticoagulants: Secondary | ICD-10-CM | POA: Diagnosis not present

## 2016-12-23 DIAGNOSIS — I4891 Unspecified atrial fibrillation: Secondary | ICD-10-CM

## 2016-12-23 DIAGNOSIS — I4821 Permanent atrial fibrillation: Secondary | ICD-10-CM

## 2016-12-23 LAB — POCT INR: INR: 2.6

## 2017-02-03 ENCOUNTER — Ambulatory Visit (INDEPENDENT_AMBULATORY_CARE_PROVIDER_SITE_OTHER): Payer: Medicare Other | Admitting: *Deleted

## 2017-02-03 DIAGNOSIS — Z7901 Long term (current) use of anticoagulants: Secondary | ICD-10-CM

## 2017-02-03 DIAGNOSIS — I482 Chronic atrial fibrillation: Secondary | ICD-10-CM

## 2017-02-03 DIAGNOSIS — Z5181 Encounter for therapeutic drug level monitoring: Secondary | ICD-10-CM

## 2017-02-03 DIAGNOSIS — I4821 Permanent atrial fibrillation: Secondary | ICD-10-CM

## 2017-02-03 DIAGNOSIS — I4891 Unspecified atrial fibrillation: Secondary | ICD-10-CM

## 2017-02-03 LAB — POCT INR: INR: 2.5

## 2017-02-21 ENCOUNTER — Other Ambulatory Visit: Payer: Self-pay | Admitting: Physician Assistant

## 2017-03-10 ENCOUNTER — Ambulatory Visit (INDEPENDENT_AMBULATORY_CARE_PROVIDER_SITE_OTHER): Payer: Medicare Other | Admitting: Pharmacist

## 2017-03-10 DIAGNOSIS — I4891 Unspecified atrial fibrillation: Secondary | ICD-10-CM

## 2017-03-10 DIAGNOSIS — Z5181 Encounter for therapeutic drug level monitoring: Secondary | ICD-10-CM

## 2017-03-10 DIAGNOSIS — I482 Chronic atrial fibrillation: Secondary | ICD-10-CM

## 2017-03-10 DIAGNOSIS — Z7901 Long term (current) use of anticoagulants: Secondary | ICD-10-CM | POA: Diagnosis not present

## 2017-03-10 DIAGNOSIS — I4821 Permanent atrial fibrillation: Secondary | ICD-10-CM

## 2017-03-10 LAB — POCT INR: INR: 3.1

## 2017-04-26 ENCOUNTER — Ambulatory Visit (INDEPENDENT_AMBULATORY_CARE_PROVIDER_SITE_OTHER): Payer: Medicare Other | Admitting: *Deleted

## 2017-04-26 DIAGNOSIS — I4891 Unspecified atrial fibrillation: Secondary | ICD-10-CM | POA: Diagnosis not present

## 2017-04-26 DIAGNOSIS — Z7901 Long term (current) use of anticoagulants: Secondary | ICD-10-CM

## 2017-04-26 DIAGNOSIS — Z5181 Encounter for therapeutic drug level monitoring: Secondary | ICD-10-CM | POA: Diagnosis not present

## 2017-04-26 DIAGNOSIS — I4821 Permanent atrial fibrillation: Secondary | ICD-10-CM

## 2017-04-26 DIAGNOSIS — I482 Chronic atrial fibrillation: Secondary | ICD-10-CM

## 2017-04-26 LAB — POCT INR: INR: 2.4

## 2017-06-09 ENCOUNTER — Ambulatory Visit (INDEPENDENT_AMBULATORY_CARE_PROVIDER_SITE_OTHER): Payer: Medicare Other | Admitting: *Deleted

## 2017-06-09 DIAGNOSIS — I4821 Permanent atrial fibrillation: Secondary | ICD-10-CM

## 2017-06-09 DIAGNOSIS — I4891 Unspecified atrial fibrillation: Secondary | ICD-10-CM

## 2017-06-09 DIAGNOSIS — Z7901 Long term (current) use of anticoagulants: Secondary | ICD-10-CM | POA: Diagnosis not present

## 2017-06-09 DIAGNOSIS — Z5181 Encounter for therapeutic drug level monitoring: Secondary | ICD-10-CM

## 2017-06-09 LAB — POCT INR: INR: 2.7

## 2017-07-21 ENCOUNTER — Encounter (INDEPENDENT_AMBULATORY_CARE_PROVIDER_SITE_OTHER): Payer: Self-pay

## 2017-07-21 ENCOUNTER — Ambulatory Visit (INDEPENDENT_AMBULATORY_CARE_PROVIDER_SITE_OTHER): Payer: Medicare Other | Admitting: *Deleted

## 2017-07-21 DIAGNOSIS — I4891 Unspecified atrial fibrillation: Secondary | ICD-10-CM

## 2017-07-21 DIAGNOSIS — I482 Chronic atrial fibrillation: Secondary | ICD-10-CM | POA: Diagnosis not present

## 2017-07-21 DIAGNOSIS — Z7901 Long term (current) use of anticoagulants: Secondary | ICD-10-CM

## 2017-07-21 DIAGNOSIS — Z5181 Encounter for therapeutic drug level monitoring: Secondary | ICD-10-CM | POA: Diagnosis not present

## 2017-07-21 DIAGNOSIS — I4821 Permanent atrial fibrillation: Secondary | ICD-10-CM

## 2017-07-21 LAB — POCT INR: INR: 3.2

## 2017-08-08 ENCOUNTER — Ambulatory Visit (INDEPENDENT_AMBULATORY_CARE_PROVIDER_SITE_OTHER): Payer: Medicare Other | Admitting: Pharmacist

## 2017-08-08 DIAGNOSIS — Z5181 Encounter for therapeutic drug level monitoring: Secondary | ICD-10-CM

## 2017-08-08 DIAGNOSIS — Z7901 Long term (current) use of anticoagulants: Secondary | ICD-10-CM

## 2017-08-08 DIAGNOSIS — I482 Chronic atrial fibrillation: Secondary | ICD-10-CM | POA: Diagnosis not present

## 2017-08-08 DIAGNOSIS — I4821 Permanent atrial fibrillation: Secondary | ICD-10-CM

## 2017-08-08 DIAGNOSIS — I4891 Unspecified atrial fibrillation: Secondary | ICD-10-CM

## 2017-08-08 LAB — POCT INR: INR: 2.9

## 2017-08-08 NOTE — Patient Instructions (Signed)
Continue same dose of 1 tablet everyday except 1.5 tablets on Mondays, Wednesdays, and Fridays. Recheck INR in 4 weeks. Call us with any medication changes or questions # [phone].

## 2017-08-31 ENCOUNTER — Other Ambulatory Visit: Payer: Self-pay | Admitting: Physician Assistant

## 2017-09-08 ENCOUNTER — Ambulatory Visit (INDEPENDENT_AMBULATORY_CARE_PROVIDER_SITE_OTHER): Payer: Medicare Other | Admitting: *Deleted

## 2017-09-08 DIAGNOSIS — Z5181 Encounter for therapeutic drug level monitoring: Secondary | ICD-10-CM

## 2017-09-08 DIAGNOSIS — I4891 Unspecified atrial fibrillation: Secondary | ICD-10-CM

## 2017-09-08 DIAGNOSIS — Z7901 Long term (current) use of anticoagulants: Secondary | ICD-10-CM

## 2017-09-08 DIAGNOSIS — I4821 Permanent atrial fibrillation: Secondary | ICD-10-CM

## 2017-09-08 LAB — POCT INR: INR: 2.8

## 2017-09-08 NOTE — Patient Instructions (Signed)
Description   Continue same dose of 1 tablet everyday except 1.5 tablets on Mondays, Wednesdays, and Fridays. Recheck INR in 4 weeks. Call us with any medication changes or questions # [phone].

## 2017-10-06 ENCOUNTER — Ambulatory Visit (INDEPENDENT_AMBULATORY_CARE_PROVIDER_SITE_OTHER): Payer: Medicare Other | Admitting: Pharmacist

## 2017-10-06 DIAGNOSIS — I482 Chronic atrial fibrillation: Secondary | ICD-10-CM | POA: Diagnosis not present

## 2017-10-06 DIAGNOSIS — I4821 Permanent atrial fibrillation: Secondary | ICD-10-CM

## 2017-10-06 DIAGNOSIS — Z7901 Long term (current) use of anticoagulants: Secondary | ICD-10-CM | POA: Diagnosis not present

## 2017-10-06 DIAGNOSIS — I4891 Unspecified atrial fibrillation: Secondary | ICD-10-CM | POA: Diagnosis not present

## 2017-10-06 DIAGNOSIS — Z5181 Encounter for therapeutic drug level monitoring: Secondary | ICD-10-CM

## 2017-10-06 LAB — POCT INR: INR: 2.6

## 2017-10-06 NOTE — Patient Instructions (Signed)
Description   Continue same dose of 1 tablet everyday except 1.5 tablets on Mondays, Wednesdays, and Fridays. Recheck INR in 6 weeks. Call us with any medication changes or questions # [phone].      

## 2017-11-23 ENCOUNTER — Ambulatory Visit: Payer: Medicare Other | Admitting: Physician Assistant

## 2017-11-28 ENCOUNTER — Encounter: Payer: Self-pay | Admitting: Physician Assistant

## 2017-11-28 ENCOUNTER — Ambulatory Visit (INDEPENDENT_AMBULATORY_CARE_PROVIDER_SITE_OTHER): Payer: Medicare Other | Admitting: *Deleted

## 2017-11-28 ENCOUNTER — Ambulatory Visit: Payer: Medicare Other | Admitting: Physician Assistant

## 2017-11-28 VITALS — BP 136/74 | HR 77 | Ht 74.0 in | Wt 219.8 lb

## 2017-11-28 DIAGNOSIS — Z5181 Encounter for therapeutic drug level monitoring: Secondary | ICD-10-CM

## 2017-11-28 DIAGNOSIS — I4821 Permanent atrial fibrillation: Secondary | ICD-10-CM

## 2017-11-28 DIAGNOSIS — Z7901 Long term (current) use of anticoagulants: Secondary | ICD-10-CM | POA: Diagnosis not present

## 2017-11-28 DIAGNOSIS — E782 Mixed hyperlipidemia: Secondary | ICD-10-CM

## 2017-11-28 DIAGNOSIS — I4891 Unspecified atrial fibrillation: Secondary | ICD-10-CM

## 2017-11-28 DIAGNOSIS — I482 Chronic atrial fibrillation: Secondary | ICD-10-CM

## 2017-11-28 DIAGNOSIS — I119 Hypertensive heart disease without heart failure: Secondary | ICD-10-CM | POA: Diagnosis not present

## 2017-11-28 DIAGNOSIS — R0602 Shortness of breath: Secondary | ICD-10-CM | POA: Diagnosis not present

## 2017-11-28 LAB — POCT INR: INR: 2.2

## 2017-11-28 NOTE — Patient Instructions (Signed)
Medication Instructions:  1. Your physician recommends that you continue on your current medications as directed. Please refer to the Current Medication list given to you today.   Labwork: TODAY BMET, CBC  Testing/Procedures: 1. Your physician has requested that you have an echocardiogram. Echocardiography is a painless test that uses sound waves to create images of your heart. It provides your doctor with information about the size and shape of your heart and how well your heart's chambers and valves are working. This procedure takes approximately one hour. There are no restrictions for this procedure.    Follow-Up: Your physician wants you to follow-up in: 6 MONTHS WITH SCOTT WEAVER, [facility]  You will receive a reminder letter in the mail two months in advance. If you don't receive a letter, please call our office to schedule the follow-up appointment.   Any Other Special Instructions Will Be Listed Below (If Applicable).     If you need a refill on your cardiac medications before your next appointment, please call your pharmacy.

## 2017-11-28 NOTE — Patient Instructions (Signed)
Description   Continue same dose of 1 tablet everyday except 1.5 tablets on Mondays, Wednesdays, and Fridays. Recheck INR in 6 weeks. Call us with any medication changes or questions # [phone].      

## 2017-11-28 NOTE — Progress Notes (Signed)
Cardiology Office Note:    Date:  11/28/2017   ID:  Mark Rivas, DOB 05/23/1928, MRN [id number]  PCP:  Burton Apley, MD  Cardiologist:   Prior patient of Dr. Cassell Clement >> Mark Newcomer, PA-C (will est with my Care Team)  Referring MD: Burton Apley, MD   Chief Complaint  Patient presents with  . Follow-up    AFib  . Shortness of Breath    History of Present Illness:    Mark Rivas is a 84 y.o. male with SSS, PAF, HL. He had several DCCVs in 2010 and eventual ablation of AFlutter in 07/2009. He subsequently went into AFib. He has been managed with a rate control strategy since. He is on Coumadin for anticoagulation. He has a hx of orthostatic hypotension and Amlodipine was reduced in the past.  He was last seen in March 2018.  Mark Rivas returns for follow up.  He is here with his wife.  He feels more short of breath over the past few months.  He also feels like he has less energy.  He has not had any chest pain.  He has a history of benign positional vertigo and does get dizzy often.  He had one episode of lightheadedness and near syncope a few months ago.  He has not had any further symptoms since.  He denies orthopnea, paroxysmal nocturnal dyspnea edema.  He has noted a hoarse voice.  He sees his PCP next month for his annual exam and I have asked him to discuss this with his PCP at that visit.   Prior CV studies:   The following studies were reviewed today:  Echo 4/10 Mild to mod LVH, EF 55-60%, normal diastolic function, mild to mod LAE, mild Ao sclerosis, trivial AI, mild MR, mild TR, mild Pulmo HTN (RVSP 44 mmHg)  Past Medical History:  Diagnosis Date  . Allergic rhinitis   . Borderline diabetes   . Chronic atrial fibrillation (HCC)   . Dyslipidemia   . Gout   . History of anemia   . Hypercholesterolemia   . Macular degeneration   . PUD (peptic ulcer disease)    hx of pyloroplasty    Surgical Hx: The patient  has a past surgical history that includes  Cardiac electrophysiology study and ablation ([id number]); Lung surgery (1977); Stomach surgery (1975); Colonoscopy; and Hemorrhoid surgery.   Current Medications: Current Meds  Medication Sig  . amLODipine (NORVASC) 5 MG tablet Take 5 mg by mouth daily.  . Cholecalciferol (VITAMIN D3) 1000 UNITS CAPS Take 1 capsule by mouth daily.  [name] doxepin (SINEQUAN) 50 MG capsule Take 50 mg by mouth daily.  [name] loratadine (CLARITIN) 10 MG tablet Take 10 mg by mouth daily.  . meclizine (ANTIVERT) 25 MG tablet Take 25 mg by mouth 3 (three) times daily as needed for dizziness (for dizziness).  . metoprolol succinate (TOPROL-XL) 50 MG 24 hr tablet Take 50 mg by mouth daily. Take with or immediately following a meal.  . Omega-3 Fatty Acids (FISH OIL) 1000 MG CAPS Take 2,000 mg by mouth 2 (two) times daily.  [name] omeprazole (PRILOSEC) 20 MG capsule Take 20 mg by mouth daily.  . simvastatin (ZOCOR) 20 MG tablet Take 20 mg by mouth at bedtime.  [name] warfarin (COUMADIN) 5 MG tablet TAKE AS DIRECTED BY ANTICOAGULATION CLINIC     Allergies:   Patient has no active allergies.   Social History   Tobacco Use  . Smoking status: Former Smoker  Last attempt to quit: 09/14/1975    Years since quitting: 42.2  . Smokeless tobacco: Former Engineer, waterUser  Substance Use Topics  . Alcohol use: Not on file  . Drug use: Not on file     Family Hx: The patient's family history includes Heart attack in his mother; Heart disease in his father and sister.  ROS:   Please see the history of present illness.    Review of Systems  HENT: Positive for hearing loss.   Cardiovascular: Positive for dyspnea on exertion and irregular heartbeat.  Respiratory: Positive for snoring.   Hematologic/Lymphatic: Bruises/bleeds easily.  Neurological: Positive for loss of balance.   All other systems reviewed and are negative.   EKGs/Labs/Other Test Reviewed:    EKG:  EKG is  ordered today.  The ekg ordered today demonstrates atrial fibrillation, heart  rate 77  Recent Labs: No results found for requested labs within last 8760 hours.   Recent Lipid Panel No results found for: CHOL, TRIG, HDL, CHOLHDL, LDLCALC, LDLDIRECT  Physical Exam:    VS:  BP 136/74   Pulse 77   Ht 6\' 2"  (1.88 m)   Wt 219 lb 12.8 oz (99.7 kg)   SpO2 94%   BMI 28.22 kg/m     Wt Readings from Last 3 Encounters:  11/28/17 219 lb 12.8 oz (99.7 kg)  11/23/16 216 lb 6.4 oz (98.2 kg)  05/03/16 215 lb (97.5 kg)     Physical Exam  Constitutional: He is oriented to person, place, and time. He appears well-developed and well-nourished. No distress.  HENT:  Head: Normocephalic and atraumatic.  Neck: Neck supple. No JVD present.  Cardiovascular: Normal rate. An irregularly irregular rhythm present.  No murmur heard. Pulmonary/Chest: Effort normal. He has no rales.  Abdominal: Soft.  Musculoskeletal: He exhibits no edema.  Neurological: He is alert and oriented to person, place, and time.  Skin: Skin is warm and dry.    ASSESSMENT & PLAN:    #1.  Shortness of breath  He has been somewhat short of breath with certain activities over the last several months.  He denies any chest pain.  He does not appear to be volume overloaded on exam.  He is on long-term anticoagulation with Coumadin.  He has not really noticed any bleeding issues.  However, anemia needs to be ruled out.  It has been 9 years since his last echocardiogram.  -Obtain BMET, BNP today  -Arrange echocardiogram  -Follow-up sooner if shortness of breath worsens  #2.  Permanent atrial fibrillation (HCC)  Rate is controlled.  He remains on Coumadin which is followed in our Coumadin clinic.  He did have one episode a few months ago with what sounds like near syncope.  His records indicate a prior history of sick sinus syndrome.  His rate is well controlled on ECG today.  If he has recurrent symptoms of lightheadedness or near syncope, I will have him undergo Holter monitoring to rule out significant  pauses.  Continue current medical regimen.  -Obtain BMET, CBC  -If recurrent symptoms of near syncope, arrange Holter monitor  #3.  Benign hypertensive heart disease without heart failure The patient's blood pressure is controlled on his current regimen.  Continue current therapy.   #4.  Mixed hyperlipidemia Managed by primary care.   Dispo:  Return in about 6 months (around 05/31/2018) for Routine Follow Up, w/ Mark NewcomerScott Weaver, PA-C.   Medication Adjustments/Labs and Tests Ordered: Current medicines are reviewed at length with the patient today.  Concerns regarding medicines are outlined above.  Tests Ordered: Orders Placed This Encounter  Procedures  . Basic Metabolic Panel (BMET)  . CBC  . EKG 12-Lead  . ECHOCARDIOGRAM COMPLETE   Medication Changes: No orders of the defined types were placed in this encounter.   Signed, Mark Newcomer, PA-C  11/28/2017 3:35 PM    [facility] [address] [place], [place], [place]  [address] Phone: [phone]; Fax: [phone]

## 2017-11-29 ENCOUNTER — Telehealth: Payer: Self-pay | Admitting: *Deleted

## 2017-11-29 LAB — CBC
HEMATOCRIT: 44.4 % (ref 37.5–51.0)
HEMOGLOBIN: 14.3 g/dL (ref 13.0–17.7)
MCH: 30 pg (ref 26.6–33.0)
MCHC: 32.2 g/dL (ref 31.5–35.7)
MCV: 93 fL (ref 79–97)
Platelets: 177 10*3/uL (ref 150–379)
RBC: 4.77 x10E6/uL (ref 4.14–5.80)
RDW: 13.9 % (ref 12.3–15.4)
WBC: 7 10*3/uL (ref 3.4–10.8)

## 2017-11-29 LAB — BASIC METABOLIC PANEL
BUN/Creatinine Ratio: 17 (ref 10–24)
BUN: 19 mg/dL (ref 8–27)
CALCIUM: 9.5 mg/dL (ref 8.6–10.2)
CO2: 22 mmol/L (ref 20–29)
CREATININE: 1.15 mg/dL (ref 0.76–1.27)
Chloride: 105 mmol/L (ref 96–106)
GFR calc Af Amer: 66 mL/min/{1.73_m2} (ref >59)
GFR, EST NON AFRICAN AMERICAN: 57 mL/min/{1.73_m2} — AB (ref >59)
GLUCOSE: 92 mg/dL (ref 65–99)
Potassium: 4.4 mmol/L (ref 3.5–5.2)
Sodium: 142 mmol/L (ref 134–144)

## 2017-11-29 NOTE — Telephone Encounter (Signed)
-----   Message from Beatrice LecherScott T Weaver, [place]PA-C sent at 11/29/2017  4:27 PM EDT ----- Renal function, potassium, hemoglobin normal. Continue current medications and follow up as planned.  Tereso NewcomerScott Weaver, PA-C    11/29/2017 4:26 PM

## 2017-11-29 NOTE — Telephone Encounter (Signed)
DPR ok to s/w pt's wife who has been notified of lab results for the pt by phone with verbal understanding. Pt's wife thanked me for the call.  

## 2017-12-05 ENCOUNTER — Other Ambulatory Visit: Payer: Self-pay

## 2017-12-05 ENCOUNTER — Ambulatory Visit (HOSPITAL_COMMUNITY): Payer: Medicare Other | Attending: Cardiovascular Disease

## 2017-12-05 DIAGNOSIS — I4891 Unspecified atrial fibrillation: Secondary | ICD-10-CM | POA: Diagnosis not present

## 2017-12-05 DIAGNOSIS — I272 Pulmonary hypertension, unspecified: Secondary | ICD-10-CM | POA: Insufficient documentation

## 2017-12-05 DIAGNOSIS — R0602 Shortness of breath: Secondary | ICD-10-CM

## 2017-12-05 DIAGNOSIS — I77819 Aortic ectasia, unspecified site: Secondary | ICD-10-CM | POA: Diagnosis not present

## 2017-12-05 DIAGNOSIS — I348 Other nonrheumatic mitral valve disorders: Secondary | ICD-10-CM | POA: Diagnosis not present

## 2017-12-05 DIAGNOSIS — I10 Essential (primary) hypertension: Secondary | ICD-10-CM | POA: Diagnosis not present

## 2017-12-05 DIAGNOSIS — E785 Hyperlipidemia, unspecified: Secondary | ICD-10-CM | POA: Diagnosis not present

## 2017-12-06 ENCOUNTER — Encounter: Payer: Self-pay | Admitting: Physician Assistant

## 2018-01-12 ENCOUNTER — Ambulatory Visit (INDEPENDENT_AMBULATORY_CARE_PROVIDER_SITE_OTHER): Payer: Medicare Other | Admitting: *Deleted

## 2018-01-12 DIAGNOSIS — I4891 Unspecified atrial fibrillation: Secondary | ICD-10-CM | POA: Diagnosis not present

## 2018-01-12 DIAGNOSIS — Z7901 Long term (current) use of anticoagulants: Secondary | ICD-10-CM

## 2018-01-12 DIAGNOSIS — Z5181 Encounter for therapeutic drug level monitoring: Secondary | ICD-10-CM | POA: Diagnosis not present

## 2018-01-12 DIAGNOSIS — I4821 Permanent atrial fibrillation: Secondary | ICD-10-CM

## 2018-01-12 LAB — POCT INR: INR: 2.3

## 2018-01-12 NOTE — Patient Instructions (Signed)
Description   Continue same dose of 1 tablet everyday except 1.5 tablets on Mondays, Wednesdays, and Fridays. Recheck INR in 6 weeks. Call us with any medication changes or questions # [phone].      

## 2018-02-23 ENCOUNTER — Ambulatory Visit (INDEPENDENT_AMBULATORY_CARE_PROVIDER_SITE_OTHER): Payer: Medicare Other

## 2018-02-23 DIAGNOSIS — Z7901 Long term (current) use of anticoagulants: Secondary | ICD-10-CM

## 2018-02-23 DIAGNOSIS — I482 Chronic atrial fibrillation: Secondary | ICD-10-CM

## 2018-02-23 DIAGNOSIS — I4891 Unspecified atrial fibrillation: Secondary | ICD-10-CM

## 2018-02-23 DIAGNOSIS — I4821 Permanent atrial fibrillation: Secondary | ICD-10-CM

## 2018-02-23 DIAGNOSIS — Z5181 Encounter for therapeutic drug level monitoring: Secondary | ICD-10-CM

## 2018-02-23 LAB — POCT INR: INR: 2.8 (ref 2.0–3.0)

## 2018-02-23 NOTE — Patient Instructions (Signed)
Description   Continue same dose of 1 tablet everyday except 1.5 tablets on Mondays, Wednesdays, and Fridays. Recheck INR in 6 weeks. Call us with any medication changes or questions # [phone].      

## 2018-02-25 ENCOUNTER — Other Ambulatory Visit: Payer: Self-pay | Admitting: Physician Assistant

## 2018-03-23 ENCOUNTER — Other Ambulatory Visit: Payer: Self-pay | Admitting: Physician Assistant

## 2018-04-06 ENCOUNTER — Ambulatory Visit: Payer: Medicare Other | Admitting: *Deleted

## 2018-04-06 DIAGNOSIS — I4821 Permanent atrial fibrillation: Secondary | ICD-10-CM

## 2018-04-06 DIAGNOSIS — Z5181 Encounter for therapeutic drug level monitoring: Secondary | ICD-10-CM | POA: Diagnosis not present

## 2018-04-06 DIAGNOSIS — Z7901 Long term (current) use of anticoagulants: Secondary | ICD-10-CM

## 2018-04-06 DIAGNOSIS — I4891 Unspecified atrial fibrillation: Secondary | ICD-10-CM

## 2018-04-06 LAB — POCT INR: INR: 3.6 — AB (ref 2.0–3.0)

## 2018-04-06 NOTE — Patient Instructions (Signed)
Description   Skip today's dose, then Continue same dose of 1 tablet everyday except 1.5 tablets on Mondays, Wednesdays, and Fridays. Recheck INR in 2 weeks. Call us with any medication changes or questions # [phone].

## 2018-04-20 ENCOUNTER — Ambulatory Visit: Payer: Medicare Other | Admitting: *Deleted

## 2018-04-20 DIAGNOSIS — Z5181 Encounter for therapeutic drug level monitoring: Secondary | ICD-10-CM | POA: Diagnosis not present

## 2018-04-20 DIAGNOSIS — I4821 Permanent atrial fibrillation: Secondary | ICD-10-CM

## 2018-04-20 DIAGNOSIS — I4891 Unspecified atrial fibrillation: Secondary | ICD-10-CM

## 2018-04-20 DIAGNOSIS — Z7901 Long term (current) use of anticoagulants: Secondary | ICD-10-CM

## 2018-04-20 LAB — POCT INR: INR: 2.9 (ref 2.0–3.0)

## 2018-04-20 NOTE — Patient Instructions (Signed)
Description   Continue same dose of 1 tablet everyday except 1.5 tablets on Mondays, Wednesdays, and Fridays. Recheck INR in 4 weeks. Call us with any medication changes or questions # [phone].      

## 2018-05-04 ENCOUNTER — Encounter: Payer: Self-pay | Admitting: Physician Assistant

## 2018-05-18 ENCOUNTER — Ambulatory Visit (INDEPENDENT_AMBULATORY_CARE_PROVIDER_SITE_OTHER): Payer: Medicare Other | Admitting: Pharmacist

## 2018-05-18 DIAGNOSIS — Z7901 Long term (current) use of anticoagulants: Secondary | ICD-10-CM

## 2018-05-18 DIAGNOSIS — I4821 Permanent atrial fibrillation: Secondary | ICD-10-CM

## 2018-05-18 DIAGNOSIS — I4891 Unspecified atrial fibrillation: Secondary | ICD-10-CM | POA: Diagnosis not present

## 2018-05-18 DIAGNOSIS — Z5181 Encounter for therapeutic drug level monitoring: Secondary | ICD-10-CM

## 2018-05-18 LAB — POCT INR: INR: 2.9 (ref 2.0–3.0)

## 2018-05-18 NOTE — Patient Instructions (Signed)
Description   Continue same dose of 1 tablet everyday except 1.5 tablets on Mondays, Wednesdays, and Fridays. Recheck INR in 5 weeks. Call us with any medication changes or questions # [phone].

## 2018-05-29 ENCOUNTER — Encounter: Payer: Self-pay | Admitting: Physician Assistant

## 2018-05-29 ENCOUNTER — Ambulatory Visit: Payer: Medicare Other | Admitting: Physician Assistant

## 2018-05-29 VITALS — BP 140/80 | HR 62 | Ht 74.0 in | Wt 218.8 lb

## 2018-05-29 DIAGNOSIS — I482 Chronic atrial fibrillation: Secondary | ICD-10-CM | POA: Diagnosis not present

## 2018-05-29 DIAGNOSIS — I4821 Permanent atrial fibrillation: Secondary | ICD-10-CM

## 2018-05-29 DIAGNOSIS — I119 Hypertensive heart disease without heart failure: Secondary | ICD-10-CM

## 2018-05-29 NOTE — Patient Instructions (Addendum)
Medication Instructions:  No changes.  Continue your current medications.  If you need a refill on your cardiac medications before your next appointment, please call your pharmacy.  Labwork: None  Testing/Procedures: None  Follow-Up: Tereso NewcomerScott Weaver, PA-C in 1 year.  Any Other Special Instructions Will Be Listed Below (If Applicable).  Ask your insurance how much the following medications would be for you: Eliquis 5 mg Twice daily  Xarelto 20 mg Once daily   If you want to switch Coumadin to one of the drugs above, call me.

## 2018-05-29 NOTE — Progress Notes (Signed)
Cardiology Office Note:    Date:  05/29/2018   ID:  [name], DOB 12-10-1927, MRN [id number]  PCP:  Lorene Dy, MD  Cardiologist:  Nelva Bush, MD / Richardson Dopp, PA-C  Electrophysiologist:  None   Referring MD: Lorene Dy, MD   Chief Complaint  Patient presents with  . Follow-up    Atrial fibrillation    History of Present Illness:    Mark Rivas is a 84 y.o. male with SSS, PAF, HL. He had several DCCVs in 2010 and eventual ablation of AFlutter in 07/2009. He subsequently went into AFib. He has been managed with a rate control strategy since. He is on Coumadin for anticoagulation. He has a hx of orthostatic hypotension and Amlodipine was reduced in the past.  Last seen in 11/2017.  At his last visit, he did complain of shortness of breath.  He also noted one episode of near syncope.  His hemoglobin was normal.  An echocardiogram demonstrated normal LV function.  [name] returns for follow-up.  He is here with his wife.  Unfortunately, their daughter-in-law was just diagnosed with pancreatic cancer.  He went out of town several weeks ago.  Since coming home, his INRs have been fluctuating.  He has not had any significant change in his shortness of breath.  He denies chest pain, syncope, leg swelling.  He has not had any melena, hematochezia, hematuria.   Prior CV studies:   The following studies were reviewed today:  Echo 11/28/17 Mild LVH, EF 55-60, no RWMA, aortic root 38, MAC, trivial MR, mod LAE, normal RVSF, PASP 39 (mild pul HTN)  Echo 4/10 Mild to mod LVH, EF 46-56%, normal diastolic function, mild to mod LAE, mild Ao sclerosis, trivial AI, mild MR, mild TR, mild Pulmo HTN (RVSP 44 mmHg)  Past Medical History:  Diagnosis Date  . Allergic rhinitis   . Borderline diabetes   . Chronic atrial fibrillation ([place])   . Dyslipidemia   . Gout   . History of anemia   . History of echocardiogram    Echo 3/19: Mild LVH, EF 55-60, normal wall motion, mildly  dilated aortic root (38 mm), MAC, trivial MR, moderate LAE, normal RV SF, mild RAE, PASP 39  . Hypercholesterolemia   . Macular degeneration   . PUD (peptic ulcer disease)    hx of pyloroplasty    Surgical Hx: The patient  has a past surgical history that includes Cardiac electrophysiology study and ablation ([id number]); Lung surgery (1977); Stomach surgery (1975); Colonoscopy; and Hemorrhoid surgery.   Current Medications: Current Meds  Medication Sig  . amLODipine (NORVASC) 5 MG tablet Take 5 mg by mouth daily.  . Cholecalciferol (VITAMIN D3) 1000 UNITS CAPS Take 1 capsule by mouth daily.  [name] doxepin (SINEQUAN) 50 MG capsule Take 50 mg by mouth daily.  [name] loratadine (CLARITIN) 10 MG tablet Take 10 mg by mouth daily.  . meclizine (ANTIVERT) 25 MG tablet Take 25 mg by mouth 3 (three) times daily as needed for dizziness (for dizziness).  . metoprolol succinate (TOPROL-XL) 50 MG 24 hr tablet Take 50 mg by mouth daily. Take with or immediately following a meal.  . Omega-3 Fatty Acids (FISH OIL) 1000 MG CAPS Take 2,000 mg by mouth 2 (two) times daily.  [name] omeprazole (PRILOSEC) 20 MG capsule Take 20 mg by mouth daily.  . simvastatin (ZOCOR) 20 MG tablet Take 20 mg by mouth at bedtime.  [name] warfarin (COUMADIN) 5 MG tablet TAKE AS DIRECTED BY  ANTICOAGULATION CLINIC     Allergies:   Patient has no active allergies.   Social History   Tobacco Use  . Smoking status: Former Smoker    Last attempt to quit: 09/14/1975    Years since quitting: 42.7  . Smokeless tobacco: Former Network engineer Use Topics  . Alcohol use: Never    Frequency: Never  . Drug use: Never     Family Hx: The patient's family history includes Heart attack in his mother; Heart disease in his father and sister.  ROS:   Please see the history of present illness.    Review of Systems  Neurological: Positive for dizziness and loss of balance.   All other systems reviewed and are negative.   EKGs/Labs/Other Test Reviewed:     EKG:  EKG is  ordered today.  The ekg ordered today demonstrates atrial fibrillation, HR 62  Recent Labs: 11/28/2017: BUN 19; Creatinine, Ser 1.15; Hemoglobin 14.3; Platelets 177; Potassium 4.4; Sodium 142   Recent Lipid Panel No results found for: CHOL, TRIG, HDL, CHOLHDL, LDLCALC, LDLDIRECT  Physical Exam:    VS:  BP 140/80   Pulse 62   Ht _0  (1.88 m)   Wt 218 lb 12.8 oz (99.2 kg)   BMI 28.09 kg/m     Wt Readings from Last 3 Encounters:  05/29/18 218 lb 12.8 oz (99.2 kg)  11/28/17 219 lb 12.8 oz (99.7 kg)  11/23/16 216 lb 6.4 oz (98.2 kg)     Physical Exam  Constitutional: He is oriented to person, place, and time. He appears well-developed and well-nourished. No distress.  HENT:  Head: Normocephalic and atraumatic.  Eyes: No scleral icterus.  Neck: No JVD present.  Cardiovascular: Normal rate and regular rhythm.  No murmur heard. Pulmonary/Chest: Effort normal. He has no rales.  Abdominal: Soft. He exhibits no distension.  Musculoskeletal: He exhibits no edema.  Neurological: He is alert and oriented to person, place, and time.  Skin: Skin is warm and dry.    ASSESSMENT & PLAN:    Permanent atrial fibrillation (HCC) Heart rate is controlled.  He has had some INRs above range recently.  We discussed the pros and cons of switching from warfarin to 1 of the DOACs.  I would recommend Eliquis 5 mg twice daily.  I have provided him with the name and dose of both Eliquis and Xarelto.  If he decides to switch, I will arrange this with our Coumadin clinic.  Otherwise, he will continue on Coumadin.  Benign hypertensive heart disease without heart failure Borderline control.  Given his advanced age and history of balance issues, I think we will have to tolerate a higher than optimal blood pressure.   Dispo:  Return in about 1 year (around 05/30/2019) for Routine Follow Up, w/ Richardson Dopp, PA-C.   Medication Adjustments/Labs and Tests Ordered: Current medicines are  reviewed at length with the patient today.  Concerns regarding medicines are outlined above.  Tests Ordered: Orders Placed This Encounter  Procedures  . EKG 12-Lead   Medication Changes: No orders of the defined types were placed in this encounter.   Signed, Richardson Dopp, PA-C  05/29/2018 2:29 PM    [facility] [address], [place], [place]  [address] Phone: [phone]; Fax: [phone]

## 2018-06-22 ENCOUNTER — Ambulatory Visit: Payer: Medicare Other | Admitting: *Deleted

## 2018-06-22 DIAGNOSIS — Z5181 Encounter for therapeutic drug level monitoring: Secondary | ICD-10-CM

## 2018-06-22 DIAGNOSIS — I4821 Permanent atrial fibrillation: Secondary | ICD-10-CM

## 2018-06-22 DIAGNOSIS — I4891 Unspecified atrial fibrillation: Secondary | ICD-10-CM

## 2018-06-22 DIAGNOSIS — Z7901 Long term (current) use of anticoagulants: Secondary | ICD-10-CM | POA: Diagnosis not present

## 2018-06-22 LAB — POCT INR: INR: 2.3 (ref 2.0–3.0)

## 2018-06-22 NOTE — Patient Instructions (Signed)
Description   Continue same dose of 1 tablet everyday except 1.5 tablets on Mondays, Wednesdays, and Fridays. Recheck INR in 6 weeks. Call us with any medication changes or questions # [phone].      

## 2018-07-15 ENCOUNTER — Other Ambulatory Visit: Payer: Self-pay | Admitting: Physician Assistant

## 2018-08-03 ENCOUNTER — Ambulatory Visit: Payer: Medicare Other

## 2018-08-03 DIAGNOSIS — I4821 Permanent atrial fibrillation: Secondary | ICD-10-CM | POA: Diagnosis not present

## 2018-08-03 DIAGNOSIS — Z7901 Long term (current) use of anticoagulants: Secondary | ICD-10-CM

## 2018-08-03 DIAGNOSIS — Z5181 Encounter for therapeutic drug level monitoring: Secondary | ICD-10-CM | POA: Diagnosis not present

## 2018-08-03 DIAGNOSIS — I4891 Unspecified atrial fibrillation: Secondary | ICD-10-CM | POA: Diagnosis not present

## 2018-08-03 LAB — POCT INR: INR: 2.4 (ref 2.0–3.0)

## 2018-08-03 NOTE — Patient Instructions (Signed)
Description   Continue same dose of 1 tablet everyday except 1.5 tablets on Mondays, Wednesdays, and Fridays. Recheck INR in 6 weeks. Call us with any medication changes or questions # [phone].

## 2018-09-14 ENCOUNTER — Ambulatory Visit: Payer: Medicare Other | Admitting: *Deleted

## 2018-09-14 ENCOUNTER — Encounter (INDEPENDENT_AMBULATORY_CARE_PROVIDER_SITE_OTHER): Payer: Self-pay

## 2018-09-14 ENCOUNTER — Other Ambulatory Visit: Payer: Self-pay | Admitting: Physician Assistant

## 2018-09-14 DIAGNOSIS — Z5181 Encounter for therapeutic drug level monitoring: Secondary | ICD-10-CM

## 2018-09-14 DIAGNOSIS — I4891 Unspecified atrial fibrillation: Secondary | ICD-10-CM | POA: Diagnosis not present

## 2018-09-14 DIAGNOSIS — I4821 Permanent atrial fibrillation: Secondary | ICD-10-CM

## 2018-09-14 DIAGNOSIS — Z7901 Long term (current) use of anticoagulants: Secondary | ICD-10-CM | POA: Diagnosis not present

## 2018-09-14 LAB — POCT INR: INR: 3.2 — AB (ref 2.0–3.0)

## 2018-09-14 NOTE — Patient Instructions (Signed)
Description   Skip today's dose, then Continue same dose of 1 tablet everyday except 1.5 tablets on Mondays, Wednesdays, and Fridays. Recheck INR in 6 weeks. Call us with any medication changes or questions # [phone].

## 2018-10-26 ENCOUNTER — Ambulatory Visit: Payer: Medicare Other

## 2018-10-26 DIAGNOSIS — I4821 Permanent atrial fibrillation: Secondary | ICD-10-CM

## 2018-10-26 DIAGNOSIS — Z7901 Long term (current) use of anticoagulants: Secondary | ICD-10-CM

## 2018-10-26 DIAGNOSIS — Z5181 Encounter for therapeutic drug level monitoring: Secondary | ICD-10-CM | POA: Diagnosis not present

## 2018-10-26 DIAGNOSIS — I4891 Unspecified atrial fibrillation: Secondary | ICD-10-CM | POA: Diagnosis not present

## 2018-10-26 LAB — POCT INR: INR: 3 (ref 2.0–3.0)

## 2018-10-26 NOTE — Patient Instructions (Signed)
Description   Take 1/2 tablet today, then resume same dose of 1 tablet everyday except 1.5 tablets on Mondays, Wednesdays, and Fridays. Recheck INR in 6 weeks. Call us with any medication changes or questions # [phone].

## 2018-12-27 ENCOUNTER — Telehealth: Payer: Self-pay

## 2018-12-27 NOTE — Telephone Encounter (Signed)
Unable to lmom won't accept blocked calls

## 2018-12-28 ENCOUNTER — Ambulatory Visit (INDEPENDENT_AMBULATORY_CARE_PROVIDER_SITE_OTHER): Payer: Medicare Other | Admitting: Pharmacist

## 2018-12-28 ENCOUNTER — Other Ambulatory Visit: Payer: Self-pay

## 2018-12-28 DIAGNOSIS — I4891 Unspecified atrial fibrillation: Secondary | ICD-10-CM | POA: Diagnosis not present

## 2018-12-28 DIAGNOSIS — I4821 Permanent atrial fibrillation: Secondary | ICD-10-CM | POA: Diagnosis not present

## 2018-12-28 DIAGNOSIS — Z7901 Long term (current) use of anticoagulants: Secondary | ICD-10-CM

## 2018-12-28 DIAGNOSIS — Z5181 Encounter for therapeutic drug level monitoring: Secondary | ICD-10-CM

## 2018-12-28 LAB — POCT INR: INR: 3 (ref 2.0–3.0)

## 2018-12-28 MED ORDER — APIXABAN 5 MG PO TABS: 5.0000 mg | ORAL_TABLET | Freq: Two times a day (BID) | ORAL | 1 refills | Status: DC

## 2018-12-28 NOTE — Telephone Encounter (Signed)

## 2019-05-25 ENCOUNTER — Telehealth (INDEPENDENT_AMBULATORY_CARE_PROVIDER_SITE_OTHER): Payer: Medicare Other | Admitting: Physician Assistant

## 2019-05-25 ENCOUNTER — Encounter: Payer: Self-pay | Admitting: Physician Assistant

## 2019-05-25 ENCOUNTER — Other Ambulatory Visit: Payer: Self-pay

## 2019-05-25 VITALS — BP 128/78 | Ht 74.0 in | Wt 219.0 lb

## 2019-05-25 DIAGNOSIS — I4821 Permanent atrial fibrillation: Secondary | ICD-10-CM | POA: Diagnosis not present

## 2019-05-25 DIAGNOSIS — I10 Essential (primary) hypertension: Secondary | ICD-10-CM

## 2019-05-25 NOTE — Progress Notes (Signed)
Virtual Visit via Telephone Note   This visit type was conducted due to national recommendations for restrictions regarding the COVID-19 Pandemic (e.g. social distancing) in an effort to limit this patient's exposure and mitigate transmission in our community.  Due to his co-morbid illnesses, this patient is at least at moderate risk for complications without adequate follow up.  This format is felt to be most appropriate for this patient at this time.  The patient did not have access to video technology/had technical difficulties with video requiring transitioning to audio format only (telephone).  All issues noted in this document were discussed and addressed.  No physical exam could be performed with this format.  The patient did verbally consent to telehealth for [facility].   Date:  05/25/2019   ID:  Mark Rivas, DOB 06/25/1928, MRN [id number]  Patient Location: Home Provider Location: Home  PCP:  Lorene Dy, MD  Cardiologist:  Nelva Bush, MD >> Sherren Mocha, MD / Richardson Dopp, PA-C  Electrophysiologist:  None   Evaluation Performed:  Follow-Up Visit  Chief Complaint:  AFib   History of Present Illness:    Mark Rivas is a 85 y.o. male with:   Atrial flutter  S/p RFCA in 07/2009  Permanent atrial fibrillation  Hx of multiple cardioversions in 2010  Coumadin Rx (followed by [facility])  Echo 3/19: EF 55-60, PASP 39  Orthostatic hypotension  Amlodipine reduced in the past  Sick sinus syndrome  Borderline diabetes  Hyperlipidemia  The patient was last seen in September 2019.  Today, he notes he is doing well.  He has not had chest pain, shortness of breath, syncope, orthopnea, leg swelling.  He walks on a treadmill 5 days a week.  He has not had any melena, hematochezia, hematuria.  The patient does not have symptoms concerning for COVID-19 infection (fever, chills, cough, or new shortness of breath).    Past Medical History:   Diagnosis Date  . Allergic rhinitis   . Borderline diabetes   . Chronic atrial fibrillation   . Dyslipidemia   . Gout   . History of anemia   . History of echocardiogram    Echo 3/19: Mild LVH, EF 55-60, normal wall motion, mildly dilated aortic root (38 mm), MAC, trivial MR, moderate LAE, normal RV SF, mild RAE, PASP 39  . Hypercholesterolemia   . Macular degeneration   . PUD (peptic ulcer disease)    hx of pyloroplasty    Past Surgical History:  Procedure Laterality Date  . CARDIAC ELECTROPHYSIOLOGY STUDY AND ABLATION  [id number]  . COLONOSCOPY    . HEMORRHOID SURGERY    . [place]   re-tumor  . [place]   re-duodenal ulcer     Current Meds  Medication Sig  . amLODipine (NORVASC) 5 MG tablet Take 5 mg by mouth daily.  [name] apixaban (ELIQUIS) 5 MG TABS tablet Take 1 tablet (5 mg total) by mouth 2 (two) times daily.  . Cholecalciferol (VITAMIN D3) 1000 UNITS CAPS Take 1 capsule by mouth daily.  [name] loratadine (CLARITIN) 10 MG tablet Take 10 mg by mouth daily.  . meclizine (ANTIVERT) 25 MG tablet Take 25 mg by mouth 3 (three) times daily as needed for dizziness (for dizziness).  . metoprolol succinate (TOPROL-XL) 50 MG 24 hr tablet Take 50 mg by mouth daily. Take with or immediately following a meal.  . Omega-3 Fatty Acids (FISH OIL) 1000 MG CAPS Take 2,000 mg  by mouth 2 (two) times daily.  [name] omeprazole (PRILOSEC) 20 MG capsule Take 20 mg by mouth daily.  . simvastatin (ZOCOR) 20 MG tablet Take 20 mg by mouth at bedtime.     Allergies:   Patient has no active allergies.   Social History   Tobacco Use  . Smoking status: Former Smoker    Quit date: 09/14/1975    Years since quitting: 43.7  . Smokeless tobacco: Former Network engineer Use Topics  . Alcohol use: Never    Frequency: Never  . Drug use: Never     Family Hx: The patient's family history includes Heart attack in his mother; Heart disease in his father and sister.  ROS:   Please see the  history of present illness.    All other systems reviewed and are negative.   Prior CV studies:   The following studies were reviewed today:  Echo 11/28/17 Mild LVH, EF 55-60, no RWMA, aortic root 38, MAC, trivial MR, mod LAE, normal RVSF, PASP 39 (mild pul HTN)   Echo 4/10 Mild to mod LVH, EF 67-67%, normal diastolic function, mild to mod LAE, mild Ao sclerosis, trivial AI, mild MR, mild TR, mild Pulmo HTN (RVSP 44 mmHg)  Labs/Other Tests and Data Reviewed:    EKG:  No ECG reviewed.  Recent Labs: No results found for requested labs within last 8760 hours.   Recent Lipid Panel No results found for: CHOL, TRIG, HDL, CHOLHDL, LDLCALC, LDLDIRECT  Wt Readings from Last 3 Encounters:  05/25/19 219 lb (99.3 kg)  05/29/18 218 lb 12.8 oz (99.2 kg)  11/28/17 219 lb 12.8 oz (99.7 kg)     Objective:    Vital Signs:  BP 128/78   Ht _0  (1.88 m)   Wt 219 lb (99.3 kg)   BMI 28.12 kg/m    VITAL SIGNS:  reviewed GEN:  no acute distress RESPIRATORY:  No labored breathing NEURO:  Alert and oriented PSYCH:  Normal mood  ASSESSMENT & PLAN:    1. Permanent atrial fibrillation Overall, he seems to be stable.  He was switched from Coumadin to apixaban several months ago.  He is tolerating apixaban well.  I will request his most recent BMET and CBC from his primary care physician.  Continue current medical therapy and follow-up with me in 1 year.  2. Essential hypertension The patient's blood pressure is controlled on his current regimen.  Continue current therapy.   Time:   Today, I have spent 8 minutes with the patient with telehealth technology discussing the above problems.     Medication Adjustments/Labs and Tests Ordered: Current medicines are reviewed at length with the patient today.  Concerns regarding medicines are outlined above.   Tests Ordered: No orders of the defined types were placed in this encounter.   Medication Changes: No orders of the defined types were  placed in this encounter.   Follow Up:  Virtual Visit or In Person in 1 year(s).  He was previously followed by Dr. Saunders Revel.  I will follow him going forward along with Dr. Burt Knack.  Signed, Richardson Dopp, PA-C  05/25/2019 12:47 PM    [facility]

## 2019-05-25 NOTE — Patient Instructions (Signed)
Medication Instructions:  Your physician recommends that you continue on your current medications as directed. Please refer to the Current Medication list given to you today.   If you need a refill on your cardiac medications before your next appointment, please call your pharmacy.   Lab work: NONE  If you have labs (blood work) drawn today and your tests are completely normal, you will receive your results only by: . MyChart Message (if you have MyChart) OR . A paper copy in the mail If you have any lab test that is abnormal or we need to change your treatment, we will call you to review the results.  Testing/Procedures: NONE  Follow-Up: At [facility], you and your health needs are our priority.  As part of our continuing mission to provide you with exceptional heart care, we have created designated Provider Care Teams.  These Care Teams include your primary Cardiologist (physician) and Advanced Practice Providers (APPs -  Physician Assistants and Nurse Practitioners) who all work together to provide you with the care you need, when you need it. . You will need a follow up appointment in:  12 months.  Please call our office 2 months in advance to schedule this appointment.  You may see Scott Weaver, PA-C  Any Other Special Instructions Will Be Listed Below (If Applicable).    

## 2019-06-01 ENCOUNTER — Telehealth: Payer: Medicare Other | Admitting: Physician Assistant

## 2019-08-14 ENCOUNTER — Other Ambulatory Visit: Payer: Self-pay

## 2019-08-14 DIAGNOSIS — Z20822 Contact with and (suspected) exposure to covid-19: Secondary | ICD-10-CM

## 2019-08-15 LAB — NOVEL CORONAVIRUS, NAA: SARS-CoV-2, NAA: NOT DETECTED

## 2019-10-10 ENCOUNTER — Ambulatory Visit: Payer: Medicare Other

## 2019-10-18 ENCOUNTER — Ambulatory Visit: Payer: Medicare Other | Attending: Internal Medicine

## 2019-10-18 DIAGNOSIS — Z23 Encounter for immunization: Secondary | ICD-10-CM | POA: Insufficient documentation

## 2019-10-18 NOTE — Progress Notes (Signed)
   Covid-19 Vaccination Clinic  Name:  ELIZER BOSTIC    MRN: [id number] DOB: 1928-02-27  10/18/2019  Mr. Hoiland was observed post Covid-19 immunization for 15 minutes without incidence. He was provided with Vaccine Information Sheet and instruction to access the V-Safe system.   Mr. Dittmar was instructed to call 911 with any severe reactions post vaccine: [name] Difficulty breathing  . Swelling of your face and throat  . A fast heartbeat  . A bad rash all over your body  . Dizziness and weakness    Immunizations Administered    Name Date Dose VIS Date Route   Pfizer COVID-19 Vaccine 10/18/2019  1:52 PM 0.3 mL 08/24/2019 Intramuscular   Manufacturer: [employer], [employer]   Lot: [id number]   NDC: [address]-0

## 2019-11-13 ENCOUNTER — Ambulatory Visit: Payer: Medicare Other | Attending: Internal Medicine

## 2019-11-13 DIAGNOSIS — Z23 Encounter for immunization: Secondary | ICD-10-CM | POA: Insufficient documentation

## 2019-11-13 NOTE — Progress Notes (Signed)
   Covid-19 Vaccination Clinic  Name:  Mark Rivas    MRN: [id number] DOB: 01/10/1928  11/13/2019  Mark Rivas was observed post Covid-19 immunization for 15 minutes without incident. He was provided with Vaccine Information Sheet and instruction to access the V-Safe system.   Mark Rivas was instructed to call 911 with any severe reactions post vaccine: [name] Difficulty breathing  . Swelling of face and throat  . A fast heartbeat  . A bad rash all over body  . Dizziness and weakness   Immunizations Administered    Name Date Dose VIS Date Route   Pfizer COVID-19 Vaccine 11/13/2019  2:54 PM 0.3 mL 08/24/2019 Intramuscular   Manufacturer: [employer], [employer]   Lot: [id number]   NDC: [address]-5

## 2019-12-20 ENCOUNTER — Other Ambulatory Visit: Payer: Self-pay | Admitting: Internal Medicine

## 2019-12-20 DIAGNOSIS — I4821 Permanent atrial fibrillation: Secondary | ICD-10-CM

## 2019-12-21 ENCOUNTER — Other Ambulatory Visit: Payer: Self-pay

## 2019-12-21 ENCOUNTER — Other Ambulatory Visit: Payer: Medicare Other | Admitting: *Deleted

## 2019-12-21 DIAGNOSIS — I4821 Permanent atrial fibrillation: Secondary | ICD-10-CM

## 2019-12-21 NOTE — Telephone Encounter (Addendum)
Age 86, weight 99kg, BMET has not been checked in 2 years. He will require updated labs to ensure he remains on the right dose.  Called pt and spoke with his wife. He will come in today for BMET and CBC. She states he will run out of medication on Monday. Will be sure to send in refill as soon as lab results are back and we can confirm his correct dose so that he does not miss a dose.

## 2019-12-21 NOTE — Telephone Encounter (Signed)
Refill request

## 2019-12-22 LAB — CBC
Hematocrit: 40 % (ref 37.5–51.0)
Hemoglobin: 13.1 g/dL (ref 13.0–17.7)
MCH: 30 pg (ref 26.6–33.0)
MCHC: 32.8 g/dL (ref 31.5–35.7)
MCV: 92 fL (ref 79–97)
Platelets: 167 10*3/uL (ref 150–450)
RBC: 4.36 x10E6/uL (ref 4.14–5.80)
RDW: 12.3 % (ref 11.6–15.4)
WBC: 6.1 10*3/uL (ref 3.4–10.8)

## 2019-12-22 LAB — BASIC METABOLIC PANEL
BUN/Creatinine Ratio: 18 (ref 10–24)
BUN: 17 mg/dL (ref 8–27)
CO2: 24 mmol/L (ref 20–29)
Calcium: 9.2 mg/dL (ref 8.6–10.2)
Chloride: 106 mmol/L (ref 96–106)
Creatinine, Ser: 0.94 mg/dL (ref 0.76–1.27)
GFR calc Af Amer: 83 mL/min/{1.73_m2} (ref >59)
GFR calc non Af Amer: 72 mL/min/{1.73_m2} (ref >59)
Glucose: 89 mg/dL (ref 65–99)
Potassium: 4.4 mmol/L (ref 3.5–5.2)
Sodium: 141 mmol/L (ref 134–144)

## 2019-12-24 NOTE — Telephone Encounter (Signed)
Pt's age 86, wt 99.3 kg, SCr 0.94, CrCl 74.83, last ov w/ Tereso Newcomer on 05/25/19.

## 2020-05-25 NOTE — Progress Notes (Deleted)
{Choose 1 Note Type (Video or Telephone):[phone]}    Date:  05/25/2020   ID:  Mark Rivas, DOB 11-11-1927, MRN [id number] The patient was identified using 2 identifiers.  {Patient [phone]::"Home"} {Provider Location:[phone]::"Home Office"}  PCP:  Lorene Dy, MD  Cardiologist:  Sherren Mocha, MD / Richardson Dopp, PA-C *** Electrophysiologist:  None   Evaluation Performed:  {Choose Visit Type:[phone]::"Follow-Up Visit"}  Chief Complaint:  ***  Patient Profile: Mark Rivas is a 86 y.o. male with:  Atrial flutter ? S/p RFCA in 07/2009  Permanent atrial fibrillation ? Hx of multiple cardioversions in 2010 ? Coumadin Rx (followed by [facility]) ? Echo 3/19: EF 55-60, PASP 39  Orthostatic hypotension ? Amlodipine reduced in the past  Sick sinus syndrome  Borderline diabetes  Hyperlipidemia  Prior CV Studies: *** Echo 11/28/17 Mild LVH, EF 55-60, no RWMA,aorticroot 38, MAC, trivial MR, mod LAE, normal RVSF, PASP 39 (mild pul HTN)  Echo 4/10 Mild to mod LVH, EF 11-57%, normal diastolic function, mild to mod LAE, mild Ao sclerosis, trivial AI, mild MR, mild TR, mild Pulmo HTN (RVSP 44 mmHg)   History of Present Illness:   Mark Rivas was last seen in 05/2019 via Telemedicine.  He is seen for routine follow up.  ***  Past Medical History:  Diagnosis Date  . Allergic rhinitis   . Borderline diabetes   . Chronic atrial fibrillation   . Dyslipidemia   . Gout   . History of anemia   . History of echocardiogram    Echo 3/19: Mild LVH, EF 55-60, normal wall motion, mildly dilated aortic root (38 mm), MAC, trivial MR, moderate LAE, normal RV SF, mild RAE, PASP 39  . Hypercholesterolemia   . Macular degeneration   . PUD (peptic ulcer disease)    hx of pyloroplasty    Past Surgical History:  Procedure Laterality Date  . CARDIAC ELECTROPHYSIOLOGY STUDY AND ABLATION  [id number]  . COLONOSCOPY    . HEMORRHOID SURGERY    . [place]   re-tumor  . [place]   re-duodenal ulcer     No outpatient medications have been marked as taking for the 05/26/20 encounter (Appointment) with Richardson Dopp T, PA-C.     Allergies:   Patient has no active allergies.   Social History   Tobacco Use  . Smoking status: Former Smoker    Quit date: 09/14/1975    Years since quitting: 44.7  . Smokeless tobacco: Former Network engineer  . Vaping Use: Never used  Substance Use Topics  . Alcohol use: Never  . Drug use: Never     Family Hx: The patient's family history includes Heart attack in his mother; Heart disease in his father and sister.  ROS:   Please see the history of present illness.    *** All other systems reviewed and are negative.   Prior CV studies:   The following studies were reviewed today:  ***  Labs/Other Tests and Data Reviewed:    EKG:  {EKG/Telemetry Strips [phone]}  Recent Labs: 12/21/2019: BUN 17; Creatinine, Ser 0.94; Hemoglobin 13.1; Platelets 167; Potassium 4.4; Sodium 141   Recent Lipid Panel No results found for: CHOL, TRIG, HDL, CHOLHDL, LDLCALC, LDLDIRECT  Wt Readings from Last 3 Encounters:  05/25/19 219 lb (99.3 kg)  05/29/18 218 lb 12.8 oz (99.2 kg)  11/28/17 219 lb 12.8 oz (99.7 kg)     Objective:    Vital Signs:  There  were no vitals taken for this visit.   {HeartCare Virtual Exam (Optional):[phone]::"VITAL SIGNS:  reviewed"}  ASSESSMENT & PLAN:    *** 1. Permanent atrial fibrillation Overall, he seems to be stable.  He was switched from Coumadin to apixaban several months ago.  He is tolerating apixaban well.  I will request his most recent BMET and CBC from his primary care physician.  Continue current medical therapy and follow-up with me in 1 year.  2. Essential hypertension The patient's blood pressure is controlled on his current regimen.  Continue current therapy.   COVID-19 Education: The signs and symptoms of COVID-19  were discussed with the patient and how to seek care for testing (follow up with PCP or arrange E-visit).  ***The importance of social distancing was discussed today.  Time:   Today, I have spent *** minutes with the patient with telehealth technology discussing the above problems.     Medication Adjustments/Labs and Tests Ordered: Current medicines are reviewed at length with the patient today.  Concerns regarding medicines are outlined above.   Tests Ordered: No orders of the defined types were placed in this encounter.   Medication Changes: No orders of the defined types were placed in this encounter.   Follow Up:  {F/U [phone]} {follow up:15908}  Signed, Richardson Dopp, PA-C  05/25/2020 8:47 PM    [facility]

## 2020-05-26 ENCOUNTER — Telehealth: Payer: Medicare Other | Admitting: Physician Assistant

## 2020-05-26 ENCOUNTER — Other Ambulatory Visit: Payer: Self-pay

## 2020-06-10 NOTE — Progress Notes (Signed)
Virtual Visit via Telephone Note   This visit type was conducted due to national recommendations for restrictions regarding the COVID-19 Pandemic (e.g. social distancing) in an effort to limit this patient's exposure and mitigate transmission in our community.  Due to his co-morbid illnesses, this patient is at least at moderate risk for complications without adequate follow up.  This format is felt to be most appropriate for this patient at this time.  The patient did not have access to video technology/had technical difficulties with video requiring transitioning to audio format only (telephone).  All issues noted in this document were discussed and addressed.  No physical exam could be performed with this format.  Please refer to the patient's chart for his  consent to telehealth for [facility].    Date:  06/11/2020   ID:  Mark Rivas, DOB March 17, 1928, MRN [id number] The patient was identified using 2 identifiers.  Patient Location: Home Provider Location: Office/Clinic  PCP:  Lorene Dy, MD  Cardiologist:  Sherren Mocha, MD / Richardson Dopp, PA-C  Electrophysiologist:  None   Evaluation Performed:  Follow-Up Visit  Chief Complaint:  Follow-up (Atrial fibrillation)    Patient Profile: Mark Rivas is a 86 y.o. male with:  Atrial flutter ? S/p RFCA in 07/2009  Permanent atrial fibrillation ? Hx of multiple cardioversions in 2010 ? Coumadin Rx (followed by [facility]) ? Echo 3/19: EF 55-60, PASP 39  Orthostatic hypotension ? Amlodipine reduced in the past  Sick sinus syndrome  Borderline diabetes  Hyperlipidemia  Prior CV Studies: Echo 11/28/17 Mild LVH, EF 55-60, no RWMA,aorticroot 38, MAC, trivial MR, mod LAE, normal RVSF, PASP 39 (mild pul HTN)  Echo 4/10 Mild to mod LVH, EF 86-76%, normal diastolic function, mild to mod LAE, mild Ao sclerosis, trivial AI, mild MR, mild TR, mild Pulmo HTN (RVSP 44 mmHg)   History of Present Illness:    Mark Rivas is seen for follow-up.  His wife helps as he does have difficulty hearing.  He has not had chest discomfort, shortness of breath, syncope, orthopnea.  He does get off balance at times and feels lightheaded.  He also has issues with vertigo.  He is a prior history of orthostatic hypotension requiring reduction in his dose of amlodipine.  Blood pressures recently have been optimal.  He has not had any bleeding issues such as melena or hematochezia.   Past Medical History:  Diagnosis Date  . Allergic rhinitis   . Borderline diabetes   . Chronic atrial fibrillation ([place])   . Dyslipidemia   . Gout   . History of anemia   . History of echocardiogram    Echo 3/19: Mild LVH, EF 55-60, normal wall motion, mildly dilated aortic root (38 mm), MAC, trivial MR, moderate LAE, normal RV SF, mild RAE, PASP 39  . Hypercholesterolemia   . Macular degeneration   . PUD (peptic ulcer disease)    hx of pyloroplasty    Past Surgical History:  Procedure Laterality Date  . CARDIAC ELECTROPHYSIOLOGY STUDY AND ABLATION  [id number]  . COLONOSCOPY    . HEMORRHOID SURGERY    . [place]   re-tumor  . [place]   re-duodenal ulcer     Current Meds  Medication Sig  . amLODipine (NORVASC) 5 MG tablet Take 5 mg by mouth daily.  [name] apixaban (ELIQUIS) 5 MG TABS tablet Take 1 tablet (5 mg total) by mouth 2 (two) times daily.  [name]  Cholecalciferol (VITAMIN D3) 1000 UNITS CAPS Take 1 capsule by mouth daily.  . fexofenadine (ALLEGRA) 180 MG tablet Take 180 mg by mouth daily.  . meclizine (ANTIVERT) 25 MG tablet Take 25 mg by mouth 3 (three) times daily as needed for dizziness (for dizziness).  . metoprolol succinate (TOPROL-XL) 50 MG 24 hr tablet Take 50 mg by mouth daily. Take with or immediately following a meal.  . Omega-3 Fatty Acids (FISH OIL) 1000 MG CAPS Take 2,000 mg by mouth 2 (two) times daily.  [name] omeprazole (PRILOSEC) 20 MG capsule Take 20 mg by mouth daily.  . simvastatin  (ZOCOR) 20 MG tablet Take 20 mg by mouth at bedtime.  . [DISCONTINUED] ELIQUIS 5 MG TABS tablet TAKE 1 TABLET BY MOUTH TWICE A DAY     Allergies:   Patient has no known allergies.   Social History   Tobacco Use  . Smoking status: Former Smoker    Quit date: 09/14/1975    Years since quitting: 44.7  . Smokeless tobacco: Former Network engineer  . Vaping Use: Never used  Substance Use Topics  . Alcohol use: Never  . Drug use: Never     Family Hx: The patient's family history includes Heart attack in his mother; Heart disease in his father and sister.  ROS:   Please see the history of present illness.       Labs/Other Tests and Data Reviewed:    EKG:  No ECG reviewed.  Recent Labs: 12/21/2019: BUN 17; Creatinine, Ser 0.94; Hemoglobin 13.1; Platelets 167; Potassium 4.4; Sodium 141   Recent Lipid Panel No results found for: CHOL, TRIG, HDL, CHOLHDL, LDLCALC, LDLDIRECT  Wt Readings from Last 3 Encounters:  06/11/20 205 lb (93 kg)  05/25/19 219 lb (99.3 kg)  05/29/18 218 lb 12.8 oz (99.2 kg)     Objective:    Vital Signs:  BP 128/64   Pulse 72   Ht _0  (1.88 m)   Wt 205 lb (93 kg)   BMI 26.32 kg/m    VITAL SIGNS:  reviewed GEN:  no acute distress PSYCH:  normal affect  ASSESSMENT & PLAN:    1. Permanent atrial fibrillation ([place]) 2. Dizziness He seems to be tolerating anticoagulation well.  His weight is 93 kg and his most recent creatinine was 0.94.  Therefore, he will continue on Apixaban 5 mg twice daily.  He does note issues with balance as well as lightheadedness.  I suspect most of his symptoms are related to imbalance.  However, he does have a prior history of sick sinus syndrome.  His heart rate on last EKG was in the 60s.  I recommended proceeding with a 3-day ZIO to assess for significant bradycardia arrhythmias.  Follow-up in 6 months in person with an EKG.  3. Essential hypertension The patient's blood pressure is controlled on his current regimen.   Continue current therapy.    COVID-19 Education: He and his wife both have received 2 doses of the [place] vaccine.  I have encouraged him to pursue getting a third dose as recommended by the CDC.  Time:   Today, I have spent 9 minutes with the patient with telehealth technology discussing the above problems.     Medication Adjustments/Labs and Tests Ordered: Current medicines are reviewed at length with the patient today.  Concerns regarding medicines are outlined above.   Tests Ordered: Orders Placed This Encounter  Procedures  . LONG TERM MONITOR (3-14 DAYS)    Medication Changes:  Meds ordered this encounter  Medications  . apixaban (ELIQUIS) 5 MG TABS tablet    Sig: Take 1 tablet (5 mg total) by mouth 2 (two) times daily.    Dispense:  180 tablet    Refill:  3    Follow Up:  In Person in 6 month(s)  Signed, Richardson Dopp, PA-C  06/11/2020 10:28 AM    [place]

## 2020-06-11 ENCOUNTER — Telehealth: Payer: Self-pay | Admitting: *Deleted

## 2020-06-11 ENCOUNTER — Telehealth (INDEPENDENT_AMBULATORY_CARE_PROVIDER_SITE_OTHER): Payer: Medicare Other | Admitting: Physician Assistant

## 2020-06-11 ENCOUNTER — Other Ambulatory Visit: Payer: Self-pay

## 2020-06-11 ENCOUNTER — Encounter: Payer: Self-pay | Admitting: Physician Assistant

## 2020-06-11 VITALS — BP 128/64 | HR 72 | Ht 74.0 in | Wt 205.0 lb

## 2020-06-11 DIAGNOSIS — I10 Essential (primary) hypertension: Secondary | ICD-10-CM

## 2020-06-11 DIAGNOSIS — I4821 Permanent atrial fibrillation: Secondary | ICD-10-CM | POA: Diagnosis not present

## 2020-06-11 DIAGNOSIS — R42 Dizziness and giddiness: Secondary | ICD-10-CM | POA: Diagnosis not present

## 2020-06-11 MED ORDER — APIXABAN 5 MG PO TABS: 5.0000 mg | ORAL_TABLET | Freq: Two times a day (BID) | ORAL | 3 refills | Status: DC

## 2020-06-11 NOTE — Telephone Encounter (Signed)
Patient enrolled for [employer] to ship a 3 day ZIO XT long term holter monitor to [address] [place], [place] [address]

## 2020-06-11 NOTE — Patient Instructions (Signed)
Medication Instructions:  Your physician recommends that you continue on your current medications as directed. Please refer to the Current Medication list given to you today.  *If you need a refill on your cardiac medications before your next appointment, please call your pharmacy*  Lab Work: None ordered today  Testing/Procedures: A zio monitor was ordered today. It will remain on for 3 days. You will then return monitor and event diary in provided box. It takes 1-2 weeks for report to be downloaded and returned to [place]. We will call you with the results. If monitor falls off or has orange flashing light, please call Zio for further instructions.   Follow-Up: At [facility], you and your health needs are our priority.  As part of our continuing mission to provide you with exceptional heart care, we have created designated Provider Care Teams.  These Care Teams include your primary Cardiologist (physician) and Advanced Practice Providers (APPs -  Physician Assistants and Nurse Practitioners) who all work together to provide you with the care you need, when you need it.  Your next appointment:   6 month(s)  The format for your next appointment:   In Person  Provider:   Tereso Newcomer, PA-C

## 2020-06-24 ENCOUNTER — Telehealth: Payer: Self-pay | Admitting: *Deleted

## 2020-06-24 NOTE — Telephone Encounter (Signed)
Mrs. Mark Rivas contacted [employer] because they had not received the ZIO patch monitor. Aram Beecham at [place] tracked package.  It had been held up at [facility] and was now being returned to [facility]. Aram Beecham will send another monitor out to patient overnight shipping.

## 2020-06-26 ENCOUNTER — Other Ambulatory Visit (INDEPENDENT_AMBULATORY_CARE_PROVIDER_SITE_OTHER): Payer: Medicare Other

## 2020-06-26 DIAGNOSIS — I4821 Permanent atrial fibrillation: Secondary | ICD-10-CM

## 2020-06-26 DIAGNOSIS — R42 Dizziness and giddiness: Secondary | ICD-10-CM | POA: Diagnosis not present

## 2020-07-08 ENCOUNTER — Ambulatory Visit
Admission: RE | Admit: 2020-07-08 | Discharge: 2020-07-08 | Disposition: A | Discharge status: Home / Self Care | Payer: Medicare Other | Source: Ambulatory Visit | Attending: Internal Medicine | Admitting: Internal Medicine

## 2020-07-08 ENCOUNTER — Other Ambulatory Visit: Payer: Self-pay | Admitting: Internal Medicine

## 2020-07-08 DIAGNOSIS — R059 Cough, unspecified: Secondary | ICD-10-CM

## 2020-07-14 ENCOUNTER — Telehealth: Payer: Self-pay | Admitting: Physician Assistant

## 2020-07-14 ENCOUNTER — Encounter: Payer: Self-pay | Admitting: Physician Assistant

## 2020-07-14 MED ORDER — METOPROLOL SUCCINATE ER 25 MG PO TB24: 25.0000 mg | ORAL_TABLET | Freq: Every day | ORAL | 3 refills | Status: DC

## 2020-07-14 NOTE — Telephone Encounter (Signed)
    Pt's wife returning call to get heart monitor result

## 2020-07-14 NOTE — Telephone Encounter (Addendum)
Beatrice Lecher, PA-C  07/14/2020 8:52 AM EDT     Please call the patient. His monitor showed atrial fibrillation and average HR of 69. There were 2 pauses, likely during sleep. These are not really significant or concerning. However, he did have symptoms of balance issues recently. Given his age, symptoms and these findings, I think it would be best to reduce his Metoprolol. PLAN:  - Reduce Metoprolol succinate to 25 mg once daily  Tereso Newcomer, PA-C   07/14/2020 8:43 AM    The patient's wife has been notified of the result and verbalized understanding.  All questions (if any) were answered. Theresia Majors, RN 07/14/2020 9:48 AM

## 2020-12-07 NOTE — Progress Notes (Addendum)
Cardiology Office Note:    Date:  12/08/2020   ID:  Mark Rivas, DOB [birth year], MRN [id number]  PCP:  Lorene Dy, [place]  Cardiologist:  Sherren Mocha, MD   Advanced Practice Provider:  Liliane Shi, PA-C Electrophysiologist:  None       Referring MD: Lorene Dy, MD   Chief Complaint:  Follow-up (AFib)    Patient Profile:    Mark Rivas is a 87 y.o. male with:   Atrial flutter ? S/p RFCA in 07/2009  Permanent atrial fibrillation ? Hxof multiple cardioversions in 2010 ? CoumadinRx(followed by [facility]) ? Echo 3/19: EF 55-60, PASP 39  Orthostatic hypotension ? Amlodipine reduced in the past  Sick sinus syndrome  Borderline diabetes  Hyperlipidemia  BPPV  Prior CV Studies: Cardiac Monitor 06/2020 1. Atrial fibrillation is present througout with an average HR of 69 (40-145) bpm 2. No high-grade heart block or pathologic pauses 3. There are rare PVC's with no sustained ventricular arrhythmia 4. There are periods of slow atrial fib during usual sleep hours, with longest R-R of 3.2 seconds   Echo 11/28/17 Mild LVH, EF 55-60, no RWMA,aorticroot 38, MAC, trivial MR, mod LAE, normal RVSF, PASP 39 (mild pul HTN)  Echo 4/10 Mild to mod LVH, EF 57-01%, normal diastolic function, mild to mod LAE, mild Ao sclerosis, trivial AI, mild MR, mild TR, mild Pulmo HTN (RVSP 44 mmHg)     History of Present Illness:    Mark Rivas was last seen in 9/21 via Telemedicine.  He had symptoms of dizziness.  A Zio monitor showed AFib with nocturnal pauses of 3.2 sec. I reduced his beta-blocker dose.  He returns for f/u.  His wife notes he had more energy after reducing the dose of metoprolol.  He does have a lot of allergy issues and has had some vertiginous symptoms.  However, he has not had syncope or near syncope.  He has not had chest pain, significant shortness of breath, orthopnea or leg edema.  He was recently  placed on antibiotics by primary care for bronchitis.  He is still on antibiotics and his symptoms seem to be improving.         Past Medical History:  Diagnosis Date  . Allergic rhinitis   . Borderline diabetes   . Chronic atrial fibrillation ([place])    Zio monitor 10/21: AFib, avg HR 69, 2 pauses (likely during sleep) longest 3.2 sec; no high grade HB or pathologic pauses.  . Dyslipidemia   . Gout   . History of anemia   . History of echocardiogram    Echo 3/19: Mild LVH, EF 55-60, normal wall motion, mildly dilated aortic root (38 mm), MAC, trivial MR, moderate LAE, normal RV SF, mild RAE, PASP 39  . Hypercholesterolemia   . Macular degeneration   . PUD (peptic ulcer disease)    hx of pyloroplasty     Current Medications: Current Meds  Medication Sig  . amLODipine (NORVASC) 5 MG tablet Take 5 mg by mouth daily.  [name] apixaban (ELIQUIS) 5 MG TABS tablet Take 1 tablet (5 mg total) by mouth 2 (two) times daily.  . cephALEXin (KEFLEX) 250 MG capsule Take 250 mg by mouth 4 (four) times daily.  . Cholecalciferol (VITAMIN D3) 1000 UNITS CAPS Take 1 capsule by mouth daily.  . fexofenadine (ALLEGRA) 180 MG tablet Take 180 mg by mouth daily.  . meclizine (ANTIVERT) 25 MG tablet Take 25 mg  by mouth 3 (three) times daily as needed for dizziness (for dizziness).  . metoprolol succinate (TOPROL XL) 25 MG 24 hr tablet Take 1 tablet (25 mg total) by mouth daily.  . Omega-3 Fatty Acids (FISH OIL) 1000 MG CAPS Take 2,000 mg by mouth 2 (two) times daily.  [name] omeprazole (PRILOSEC) 20 MG capsule Take 20 mg by mouth daily.  . simvastatin (ZOCOR) 20 MG tablet Take 20 mg by mouth at bedtime.     Allergies:   Patient has no known allergies.   Social History   Tobacco Use  . Smoking status: Former Smoker    Quit date: 09/14/1975    Years since quitting: 45.2  . Smokeless tobacco: Former Network engineer  . Vaping Use: Never used  Substance Use Topics  . Alcohol use: Never  . Drug use: Never      Family Hx: The patient's family history includes Heart attack in his mother; Heart disease in his father and sister.  Review of Systems  Respiratory: Negative for hemoptysis.   Gastrointestinal: Negative for hematochezia and melena.  Genitourinary: Negative for hematuria.     EKGs/Labs/Other Test Reviewed:    EKG:  EKG is   ordered today.  The ekg ordered today demonstrates atrial fibrillation, HR 64, normal axis, nonspecific ST-T wave changes, QTC 408  Recent Labs: 12/21/2019: BUN 17; Creatinine, Ser 0.94; Hemoglobin 13.1; Platelets 167; Potassium 4.4; Sodium 141   Recent Lipid Panel No results found for: CHOL, TRIG, HDL, CHOLHDL, LDLCALC, LDLDIRECT    Risk Assessment/Calculations:    CHA2DS2-VASc Score = 4  This indicates a 4.8% annual risk of stroke. The patient's score is based upon: CHF History: No HTN History: Yes Diabetes History: Yes Stroke History: No Vascular Disease History: No Age Score: 2 Gender Score: 0     Physical Exam:    VS:  BP 122/64   Pulse 64   Ht _0  (1.88 m)   Wt 195 lb 9.6 oz (88.7 kg)   SpO2 95%   BMI 25.11 kg/m     Wt Readings from Last 3 Encounters:  12/08/20 195 lb 9.6 oz (88.7 kg)  06/11/20 205 lb (93 kg)  05/25/19 219 lb (99.3 kg)     Constitutional:      Appearance: Healthy appearance. Not in distress.  Neck:     Vascular: No JVR. JVD normal.  Pulmonary:     Effort: Pulmonary effort is normal.     Breath sounds: No wheezing. No rales.  Cardiovascular:     Normal rate. Irregularly irregular rhythm. Normal S1. Normal S2.     Murmurs: There is no murmur.  Edema:    Peripheral edema absent.  Abdominal:     Palpations: Abdomen is soft. There is no hepatomegaly.  Skin:    General: Skin is warm and dry.  Neurological:     General: No focal deficit present.     Mental Status: Alert and oriented to person, place and time.     Cranial Nerves: Cranial nerves are intact.          ASSESSMENT & PLAN:    1. Permanent  atrial fibrillation (H[facility]) Rate is well controlled.  He does feel better since decreasing the dose of metoprolol.  He is not having any bleeding issues.  Hemoglobin and creatinine were normal in 4/21.  Continue current dose of Apixaban, metoprolol succinate.  2. Essential hypertension The patient's blood pressure is controlled on his current regimen.  Continue current therapy.  Dispo:  Return in about 6 months (around 06/10/2021) for Routine Follow Up, w/ Richardson Dopp, PA-C, in person.   Medication Adjustments/Labs and Tests Ordered: Current medicines are reviewed at length with the patient today.  Concerns regarding medicines are outlined above.  Tests Ordered: Orders Placed This Encounter  Procedures  . EKG 12-Lead   Medication Changes: No orders of the defined types were placed in this encounter.   Signed, Richardson Dopp, PA-C  12/08/2020 4:50 PM    [facility] [place], [place], [place]  [address] Phone: [phone]; Fax: [phone]

## 2020-12-08 ENCOUNTER — Other Ambulatory Visit: Payer: Self-pay

## 2020-12-08 ENCOUNTER — Encounter: Payer: Self-pay | Admitting: Physician Assistant

## 2020-12-08 ENCOUNTER — Ambulatory Visit: Payer: Medicare Other | Admitting: Physician Assistant

## 2020-12-08 VITALS — BP 122/64 | HR 64 | Ht 74.0 in | Wt 195.6 lb

## 2020-12-08 DIAGNOSIS — I4821 Permanent atrial fibrillation: Secondary | ICD-10-CM

## 2020-12-08 DIAGNOSIS — I10 Essential (primary) hypertension: Secondary | ICD-10-CM | POA: Diagnosis not present

## 2020-12-08 NOTE — Patient Instructions (Signed)
Medication Instructions:  Your physician recommends that you continue on your current medications as directed. Please refer to the Current Medication list given to you today.   *If you need a refill on your cardiac medications before your next appointment, please call your pharmacy*   Lab Work: None ordered  If you have labs (blood work) drawn today and your tests are completely normal, you will receive your results only by: . MyChart Message (if you have MyChart) OR . A paper copy in the mail If you have any lab test that is abnormal or we need to change your treatment, we will call you to review the results.   Testing/Procedures: None ordered   Follow-Up: At [facility], you and your health needs are our priority.  As part of our continuing mission to provide you with exceptional heart care, we have created designated Provider Care Teams.  These Care Teams include your primary Cardiologist (physician) and Advanced Practice Providers (APPs -  Physician Assistants and Nurse Practitioners) who all work together to provide you with the care you need, when you need it.  We recommend signing up for the patient portal called "MyChart".  Sign up information is provided on this After Visit Summary.  MyChart is used to connect with patients for Virtual Visits (Telemedicine).  Patients are able to view lab/test results, encounter notes, upcoming appointments, etc.  Non-urgent messages can be sent to your provider as well.   To learn more about what you can do with MyChart, go to [email or web address]://[email or web address].com.    Your next appointment:   6 month(s)  The format for your next appointment:   In Person  Provider:   You may see Michael Cooper, MD or one of the following Advanced Practice Providers on your designated Care Team:    Scott Weaver, PA-C  Vin Bhagat, PA-C    Other Instructions  

## 2021-06-07 NOTE — Progress Notes (Addendum)
Cardiology Office Note:    Date:  06/08/2021  ID:  [name], DOB [birth year], MRN [id number]  PCP:  Mark Dy, MD   [facility] Providers Cardiologist:  Mark Mocha, MD Cardiology APP:  Mark Rivas      Referring MD: Mark Dy, MD   Chief Complaint:  F/u for AFib and Shortness of Breath   Patient Profile:   Mark Rivas is a 87 y.o. male with:  Atrial flutter S/p RFCA in 07/2009 Permanent atrial fibrillation Hx of multiple cardioversions in 2010 Coumadin Rx (followed by [facility]) Echo 3/19: EF 55-60, PASP 39 Orthostatic hypotension Amlodipine reduced in the past Sick sinus syndrome Borderline diabetes Hyperlipidemia BPPV   Prior CV Studies: Cardiac Monitor 06/2020 1. Atrial fibrillation is present througout with an average HR of 69 (40-145) bpm 2. No high-grade heart block or pathologic pauses 3. There are rare PVC's with no sustained ventricular arrhythmia 4. There are periods of slow atrial fib during usual sleep hours, with longest R-R of 3.2 seconds    Echo 11/28/17 Mild LVH, EF 55-60, no RWMA, aortic root 38, MAC, trivial MR, mod LAE, normal RVSF, PASP 39 (mild pul HTN)   Echo 4/10 Mild to mod LVH, EF 62-56%, normal diastolic function, mild to mod LAE, mild Ao sclerosis, trivial AI, mild MR, mild TR, mild Pulmo HTN (RVSP 44 mmHg)   History of Present Illness: Mark Rivas was last seen in 3/22.  He returns for f/u.  He is here with his wife.  She is now seeing Dr. Bettina Rivas in [facility] for CHF.  She notes that she has a valve problem.  Mark Rivas is doing ok.  He has a lot of allergy symptoms this time of year.  He does note shortness of breath with certain activities over the last several months.  He has not had chest pain, orthopnea, leg edema.  He does get dizzy with standing abruptly.  He has not had syncope.  He has not had had a cough or wheezing.  He has not had rapid palpitations.  His wife notes he has a long hx of snoring  loudly.          Past Medical History:  Diagnosis Date   Allergic rhinitis    Borderline diabetes    Chronic atrial fibrillation ([place])    Zio monitor 10/21: AFib, avg HR 69, 2 pauses (likely during sleep) longest 3.2 sec; no high grade HB or pathologic pauses.   Dyslipidemia    Gout    History of anemia    History of echocardiogram    Echo 3/19: Mild LVH, EF 55-60, normal wall motion, mildly dilated aortic root (38 mm), MAC, trivial MR, moderate LAE, normal RV SF, mild RAE, PASP 39   Hypercholesterolemia    Macular degeneration    PUD (peptic ulcer disease)    hx of pyloroplasty    Current Medications: Current Meds  Medication Sig   amLODipine (NORVASC) 5 MG tablet Take 5 mg by mouth daily.   apixaban (ELIQUIS) 5 MG TABS tablet Take 1 tablet (5 mg total) by mouth 2 (two) times daily.   cephALEXin (KEFLEX) 250 MG capsule Take 250 mg by mouth 4 (four) times daily.   doxepin (SINEQUAN) 50 MG capsule Take 50 mg by mouth daily.   fexofenadine (ALLEGRA) 180 MG tablet Take 180 mg by mouth daily.   meclizine (ANTIVERT) 25 MG tablet Take 25 mg by mouth 3 (three) times daily as needed  for dizziness (for dizziness).   metoprolol succinate (TOPROL XL) 25 MG 24 hr tablet Take 1 tablet (25 mg total) by mouth daily.   Omega-3 Fatty Acids (FISH OIL) 1000 MG CAPS Take 2,000 mg by mouth 2 (two) times daily.   simvastatin (ZOCOR) 20 MG tablet Take 20 mg by mouth at bedtime.    Allergies:   Patient has no known allergies.   Social History   Tobacco Use   Smoking status: Former    Types: Cigarettes    Quit date: 09/14/1975    Years since quitting: 45.7   Smokeless tobacco: Former  Scientific laboratory technician Use: Never used  Substance Use Topics   Alcohol use: Never   Drug use: Never    Family Hx: The patient's family history includes Heart attack in his mother; Heart disease in his father and sister.  Review of Systems  Gastrointestinal:  Negative for hematochezia and melena.  Genitourinary:   Negative for hematuria.    EKGs/Labs/Other Test Reviewed:    EKG:  EKG is not ordered today.  The ekg ordered today demonstrates n/a  Recent Labs: No results found for requested labs within last 8760 hours.   Recent Lipid Panel No results found for: CHOL, TRIG, HDL, LDLCALC, LDLDIRECT   Risk Assessment/Calculations:    CHA2DS2-VASc Score = 4   This indicates a 4.8% annual risk of stroke. The patient's score is based upon: CHF History: 0 HTN History: 1 Diabetes History: 1 Stroke History: 0 Vascular Disease History: 0 Age Score: 2 Gender Score: 0       Physical Exam:    VS:  BP 122/78   Pulse 90   Ht _0  (1.88 m)   Wt 194 lb 6.4 oz (88.2 kg)   SpO2 95%   BMI 24.96 kg/m     Wt Readings from Last 3 Encounters:  06/08/21 194 lb 6.4 oz (88.2 kg)  12/08/20 195 lb 9.6 oz (88.7 kg)  06/11/20 205 lb (93 kg)    Constitutional:      Appearance: Healthy appearance. Not in distress.  Neck:     Vascular: JVD normal.  Pulmonary:     Effort: Pulmonary effort is normal.     Breath sounds: No wheezing. No rales.  Cardiovascular:     Normal rate. Irregularly irregular rhythm. Normal S1. Normal S2.      Murmurs: There is no murmur.  Edema:    Peripheral edema absent.  Abdominal:     Palpations: Abdomen is soft.  Skin:    General: Skin is warm and dry.  Neurological:     General: No focal deficit present.     Mental Status: Alert and oriented to person, place and time.     Cranial Nerves: Cranial nerves are intact.     ASSESSMENT & PLAN:   1. Shortness of breath He notes shortness of breath with some activities.  His exam does not suggest volume overload.  His shortness of breath is likely multifactorial and related to age, deconditioning, atrial fibrillation.  I will get a f/u BMET, CBC today as well as an NT-Pro BNP.  I expect this # to be elevated based upon age and permanent atrial fibrillation.  However, it is significantly elevated, I will start him on low dose  furosemide.  I will also arrange an echocardiogram to evaluate his shortness of breath.  F/u in 6 mos with me.   2. Permanent atrial fibrillation (HCC) Rate remains controlled.  His HR is  a little faster today than usual but still < 110.  Monitor in 10/21 demonstrated avg HR 69.  He is tolerating anticoagulation.  Obtain BMET, CBC today.  Continue Apixaban 5 mg twice daily, metoprolol succinate 25 mg once daily.  F/u 6 mos.   3. Essential hypertension BP is well controlled on amlodipine, metoprolol succinate.    4. Snoring His wife notes he has a long hx of snoring.  If RVSP is higher on upcoming echocardiogram, I will arrange a home sleep study to evaluate for OSA.     Dispo:  Return in about 6 months (around 12/06/2021) for Routine Follow Up, w/ Richardson Dopp, PA-C.   Medication Adjustments/Labs and Tests Ordered: Current medicines are reviewed at length with the patient today.  Concerns regarding medicines are outlined above.  Tests Ordered: Orders Placed This Encounter  Procedures   Pro b natriuretic peptide (BNP)   Basic metabolic panel   CBC   ECHOCARDIOGRAM COMPLETE   Medication Changes: No orders of the defined types were placed in this encounter.  Signed, Richardson Dopp, PA-C  06/08/2021 2:15 PM    [facility] [place], [place], [place]  [address] Phone: [phone]; Fax: [phone]

## 2021-06-08 ENCOUNTER — Ambulatory Visit: Payer: Medicare Other | Admitting: Physician Assistant

## 2021-06-08 ENCOUNTER — Encounter: Payer: Self-pay | Admitting: Physician Assistant

## 2021-06-08 ENCOUNTER — Other Ambulatory Visit: Payer: Self-pay

## 2021-06-08 VITALS — BP 122/78 | HR 90 | Ht 74.0 in | Wt 194.4 lb

## 2021-06-08 DIAGNOSIS — R0602 Shortness of breath: Secondary | ICD-10-CM | POA: Diagnosis not present

## 2021-06-08 DIAGNOSIS — I10 Essential (primary) hypertension: Secondary | ICD-10-CM | POA: Diagnosis not present

## 2021-06-08 DIAGNOSIS — R0683 Snoring: Secondary | ICD-10-CM

## 2021-06-08 DIAGNOSIS — I4821 Permanent atrial fibrillation: Secondary | ICD-10-CM

## 2021-06-08 NOTE — Patient Instructions (Signed)
Medication Instructions:  Your physician recommends that you continue on your current medications as directed. Please refer to the Current Medication list given to you today.  *If you need a refill on your cardiac medications before your next appointment, please call your pharmacy*   Lab Work: Bmp, Pro Bnp, Cbc- Today   If you have labs (blood work) drawn today and your tests are completely normal, you will receive your results only by: MyChart Message (if you have MyChart) OR A paper copy in the mail If you have any lab test that is abnormal or we need to change your treatment, we will call you to review the results.   Testing/Procedures: Your physician has requested that you have an echocardiogram. Echocardiography is a painless test that uses sound waves to create images of your heart. It provides your doctor with information about the size and shape of your heart and how well your heart's chambers and valves are working. This procedure takes approximately one hour. There are no restrictions for this procedure.   Follow-Up: At [facility] you and your health needs are our priority.  As part of our continuing mission to provide you with exceptional heart care, we have created designated Provider Care Teams.  These Care Teams include your primary Cardiologist (physician) and Advanced Practice Providers (APPs -  Physician Assistants and Nurse Practitioners) who all work together to provide you with the care you need, when you need it.  We recommend signing up for the patient portal called "MyChart".  Sign up information is provided on this After Visit Summary.  MyChart is used to connect with patients for Virtual Visits (Telemedicine).  Patients are able to view lab/test results, encounter notes, upcoming appointments, etc.  Non-urgent messages can be sent to your provider as well.   To learn more about what you can do with MyChart, go to [email or web address].    Your next  appointment:   6 month(s)  The format for your next appointment:   In Person  Provider:   You may see Tereso Newcomer PA-C or one of the following Advanced Practice Providers on your designated Care Team:     Other Instructions None

## 2021-06-09 LAB — BASIC METABOLIC PANEL
BUN/Creatinine Ratio: 19 (ref 10–24)
BUN: 19 mg/dL (ref 10–36)
CO2: 23 mmol/L (ref 20–29)
Calcium: 9.5 mg/dL (ref 8.6–10.2)
Chloride: 104 mmol/L (ref 96–106)
Creatinine, Ser: 0.99 mg/dL (ref 0.76–1.27)
Glucose: 89 mg/dL (ref 70–99)
Potassium: 4.7 mmol/L (ref 3.5–5.2)
Sodium: 142 mmol/L (ref 134–144)
eGFR: 72 mL/min/{1.73_m2} (ref >59)

## 2021-06-09 LAB — CBC
Hematocrit: 40.3 % (ref 37.5–51.0)
Hemoglobin: 13.6 g/dL (ref 13.0–17.7)
MCH: 30.6 pg (ref 26.6–33.0)
MCHC: 33.7 g/dL (ref 31.5–35.7)
MCV: 91 fL (ref 79–97)
Platelets: 158 10*3/uL (ref 150–450)
RBC: 4.44 x10E6/uL (ref 4.14–5.80)
RDW: 12.6 % (ref 11.6–15.4)
WBC: 6.3 10*3/uL (ref 3.4–10.8)

## 2021-06-09 LAB — PRO B NATRIURETIC PEPTIDE: NT-Pro BNP: 853 pg/mL — ABNORMAL HIGH (ref 0–486)

## 2021-06-10 ENCOUNTER — Telehealth: Payer: Self-pay | Admitting: Physician Assistant

## 2021-06-10 NOTE — Telephone Encounter (Signed)
-----   Message from Beatrice Lecher, [place] sent at 06/09/2021  2:27 PM EDT ----- BNP not significantly elevated.  This suggests that shortness of breath is not related to volume excess. Creatinine, K+ on, Hgb normal PLAN:  -Continue current medications/treatment plan and follow up as scheduled.  Tereso Newcomer, PA-C    06/09/2021 2:27 PM

## 2021-06-10 NOTE — Telephone Encounter (Addendum)
The patients wife [name] (on [place])  has been notified of the pts result and verbalized understanding.  All questions (if any) were answered. Loa Socks, LPN 0/17/5102 5:85 PM

## 2021-06-10 NOTE — Telephone Encounter (Signed)
Patient's wife is returning call to discuss lab results. 

## 2021-06-11 ENCOUNTER — Other Ambulatory Visit: Payer: Self-pay | Admitting: Physician Assistant

## 2021-06-11 NOTE — Telephone Encounter (Signed)
Eliquis 5mg  refill request received. Patient is 87 years old, weight-88.2kg, Crea-0.99 on 06/08/2021, Diagnosis-Afib, and last seen by 06/10/2021 on 06/08/2021. Dose is appropriate based on dosing criteria. Will send in refill to requested pharmacy.

## 2021-06-22 ENCOUNTER — Ambulatory Visit (HOSPITAL_COMMUNITY): Payer: Medicare Other | Attending: Cardiovascular Disease

## 2021-06-22 ENCOUNTER — Other Ambulatory Visit: Payer: Self-pay

## 2021-06-22 DIAGNOSIS — I4821 Permanent atrial fibrillation: Secondary | ICD-10-CM | POA: Insufficient documentation

## 2021-06-22 DIAGNOSIS — I10 Essential (primary) hypertension: Secondary | ICD-10-CM | POA: Diagnosis not present

## 2021-06-22 LAB — ECHOCARDIOGRAM COMPLETE
Area-P 1/2: 5.73 cm2
MV M vel: 5.19 m/s
MV Peak grad: 107.7 mmHg
P 1/2 time: 507 msec
Radius: 0.65 cm
S' Lateral: 3.3 cm

## 2021-06-23 ENCOUNTER — Encounter: Payer: Self-pay | Admitting: Physician Assistant

## 2021-06-29 ENCOUNTER — Other Ambulatory Visit: Payer: Self-pay | Admitting: *Deleted

## 2021-07-09 ENCOUNTER — Encounter: Payer: Self-pay | Admitting: Cardiovascular Disease

## 2021-07-09 ENCOUNTER — Ambulatory Visit: Payer: Medicare Other | Admitting: Cardiovascular Disease

## 2021-07-09 ENCOUNTER — Other Ambulatory Visit: Payer: Self-pay | Admitting: Physician Assistant

## 2021-07-09 ENCOUNTER — Other Ambulatory Visit: Payer: Self-pay

## 2021-07-09 VITALS — BP 140/82 | HR 75 | Ht 74.0 in | Wt 194.0 lb

## 2021-07-09 DIAGNOSIS — I4821 Permanent atrial fibrillation: Secondary | ICD-10-CM

## 2021-07-09 DIAGNOSIS — I340 Nonrheumatic mitral (valve) insufficiency: Secondary | ICD-10-CM

## 2021-07-09 NOTE — Progress Notes (Signed)
[facility]   MULTIDISCIPLINARY HEART VALVE TEAM  Date:  07/09/2021   ID:  Mark Rivas, DOB 31-Mar-1928, MRN [id number]  PCP:  Lorene Dy, MD   Chief Complaint  Patient presents with   Shortness of Breath     HISTORY OF PRESENT ILLNESS: Mark Rivas is a 87 y.o. male who presents for evaluation of mitral regurgitation, referred by Richardson Dopp, PA. The patient's past medical history includes: Atrial flutter S/p RFCA in 07/2009 Permanent atrial fibrillation Hx of multiple cardioversions in 2010 Coumadin Rx (followed by [facility]) Echo 3/19: EF 55-60, PASP 39 Orthostatic hypotension Amlodipine reduced in the past Sick sinus syndrome Borderline diabetes Hyperlipidemia BPPV  The patient is here with his wife today. He was last seen by Richardson Dopp 06/08/21. He complained of shortness of breath at the time of that visit and an echocardiogram was ordered. As outlined above, he has longstanding persistent atrial fibrillation. He complains of progressive fatigue and intermittent shortness of breath over the past few years. He denies orthopnea, PND, or leg swelling. He denies abdominal swelling. He can walk 15-20 minutes on a treadmill without shortness of breath, but he reports exertional dyspnea with yard work. He denies chest pain or pressure. He does experience postural lightheadedness with standing but has not had frank syncope.  The patient hasn't had any major surgeries in the last several years. 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   The patient has had regular dental care and reports no problems. He sees a Pharmacist, community every 6 months. .  Past Medical History:  Diagnosis Date   Allergic rhinitis    Borderline diabetes    Chronic atrial fibrillation ([place])    Zio monitor 10/21: AFib, avg HR 69, 2 pauses (likely during sleep) longest 3.2 sec; no high grade  HB or pathologic pauses.   Dyslipidemia    Gout    History of anemia    History of echocardiogram    Echo 3/19: Mild LVH, EF 55-60, normal wall motion, mildly dilated aortic root (38 mm), MAC, trivial MR, moderate LAE, normal RV SF, mild RAE, PASP 39   Hypercholesterolemia    Macular degeneration    Mitral regurgitation    Echo 10/22: EF 55-60, no RWMA, moderate concentric LVH, mildly reduced RVSF, moderate LAE, mild RAE, moderate-severe MR, trivial AI, ascending aorta 40 mm   PUD (peptic ulcer disease)    hx of pyloroplasty     Current Outpatient Medications  Medication Sig Dispense Refill   amLODipine (NORVASC) 5 MG tablet Take 5 mg by mouth daily.     apixaban (ELIQUIS) 5 MG TABS tablet TAKE 1 TABLET BY MOUTH TWICE A DAY 180 tablet 2   cephALEXin (KEFLEX) 250 MG capsule Take 250 mg by mouth 4 (four) times daily.     doxepin (SINEQUAN) 50 MG capsule Take 50 mg by mouth daily.     fexofenadine (ALLEGRA) 180 MG tablet Take 180 mg by mouth daily.     meclizine (ANTIVERT) 25 MG tablet Take 25 mg by mouth 3 (three) times daily as needed for dizziness (for dizziness).     metoprolol succinate (TOPROL-XL) 25 MG 24 hr tablet TAKE 1 TABLET BY MOUTH EVERY DAY 90 tablet 3   Omega-3 Fatty Acids (FISH OIL) 1000 MG CAPS Take 2,000 mg by mouth 2 (two) times daily.     simvastatin (ZOCOR)  20 MG tablet Take 20 mg by mouth at bedtime.     No current facility-administered medications for this visit.    ALLERGIES:   Patient has no known allergies.   SOCIAL HISTORY:  The patient  reports that he quit smoking about 45 years ago. His smoking use included cigarettes. He has quit using smokeless tobacco. He reports that he does not drink alcohol and does not use drugs.   FAMILY HISTORY:  The patient's family history includes Heart attack in his mother; Heart disease in his father and sister.   REVIEW OF SYSTEMS:  Positive for fatigue.   All other systems are reviewed and negative.   PHYSICAL EXAM: VS:   BP 140/82   Pulse 75   Ht _0  (1.88 m)   Wt 194 lb (88 kg)   SpO2 99%   BMI 24.91 kg/m  , BMI Body mass index is 24.91 kg/m. GEN: Well nourished, well developed, in no acute distress HEENT: normal Neck: No JVD. carotids 2+ without bruits or masses Cardiac: The heart is irregularly irregular without murmurs, rubs, or gallops. No edema. Pedal pulses 2+ = bilaterally  Respiratory:  clear to auscultation bilaterally GI: soft, nontender, nondistended, + BS MS: no deformity or atrophy Skin: warm and dry, no rash Neuro:  Strength and sensation are intact Psych: euthymic mood, full affect  EKG:  EKG from today reviewed and demonstrates atrial fibrillation 75 bpm, nonspecific T wave abnormality, frequent PVC's  RECENT LABS: 06/08/2021: BUN 19; Creatinine, Ser 0.99; Hemoglobin 13.6; NT-Pro BNP 853; Platelets 158; Potassium 4.7; Sodium 142  No results found for requested labs within last 8760 hours.   CrCl cannot be calculated (Patient's most recent lab result is older than the maximum 21 days allowed.).   Wt Readings from Last 3 Encounters:  07/09/21 194 lb (88 kg)  06/08/21 194 lb 6.4 oz (88.2 kg)  12/08/20 195 lb 9.6 oz (88.7 kg)     CARDIAC STUDIES: Echo:   1. Left ventricular ejection fraction, by estimation, is 55 to 60%. The  left ventricle has normal function. The left ventricle has no regional  wall motion abnormalities. There is moderate concentric left ventricular  hypertrophy. Left ventricular  diastolic function could not be evaluated.   2. Right ventricular systolic function is mildly reduced. The right  ventricular size is normal. There is normal pulmonary artery systolic  pressure.   3. Left atrial size was moderately dilated.   4. Right atrial size was mildly dilated.   5. The mitral valve is normal in structure. Moderate to severe mitral  valve regurgitation.   6. The aortic valve is normal in structure. Aortic valve regurgitation is  trivial. No aortic  stenosis is present.   7. Aortic dilatation noted. There is mild dilatation of the ascending  aorta, measuring 40 mm.    ASSESSMENT AND PLAN: 1.  Moderate-Severe MR, non-rheumatic. 2D echo images reviewed and demonstrate preserved LV systolic function with LVEF 55-60%, moderately severe central MR with an EROA of 0.18, normal pulmonary pressures estimated. The patient has mild NYHA functional class II symptoms of exertional dyspnea and fatigue, but considering his age he is quite active.   I have reviewed the natural history of mitral regurgitation with the patient and their family members who are present today. We have discussed the limitations of medical therapy and the poor prognosis associated with symptomatic mitral regurgitation. We have also reviewed potential treatment options, including palliative medical therapy, conventional surgical mitral valve repair or  replacement, and percutaneous mitral valve therapies such as edge-to-edge mitral valve approximation with MitraClip. We discussed treatment options in the context of this patient's specific comorbid medical conditions.   I can't appreciate a murmur on the patient's exam. He is minimally limited. I think he has 3+ MR, likely atrial functional MR with a component of degenerative change with mitral annular calcification noted on his echo study. The MR could be better characterized with TEE, but I don't think further assessment would likely change management. The patient also prefers an approach of conservative management considering his advanced age. I have recommended that he continue to follow with Richardson Dopp and that we repeat a 2D echo study in one year for reassessment. We discussed keeping an eye out for progressive symptoms in the interim. He will continue on his current medical program without changes.   Deatra James 07/09/2021 1:25 PM     [address] [place]  [id number]  [phone] (office) [phone] (fax)

## 2021-07-09 NOTE — Patient Instructions (Signed)

## 2021-11-03 ENCOUNTER — Other Ambulatory Visit: Payer: Self-pay

## 2021-11-03 ENCOUNTER — Ambulatory Visit
Admission: RE | Admit: 2021-11-03 | Discharge: 2021-11-03 | Disposition: A | Discharge status: Home / Self Care | Payer: Medicare Other | Source: Ambulatory Visit | Attending: Physician Assistant | Admitting: Physician Assistant

## 2021-11-03 ENCOUNTER — Encounter: Payer: Self-pay | Admitting: Physician Assistant

## 2021-11-03 ENCOUNTER — Ambulatory Visit: Payer: Medicare Other | Admitting: Physician Assistant

## 2021-11-03 VITALS — BP 108/78 | HR 54 | Resp 16 | Ht 74.0 in | Wt 195.0 lb

## 2021-11-03 DIAGNOSIS — I340 Nonrheumatic mitral (valve) insufficiency: Secondary | ICD-10-CM

## 2021-11-03 DIAGNOSIS — I4821 Permanent atrial fibrillation: Secondary | ICD-10-CM

## 2021-11-03 DIAGNOSIS — I10 Essential (primary) hypertension: Secondary | ICD-10-CM

## 2021-11-03 DIAGNOSIS — R0602 Shortness of breath: Secondary | ICD-10-CM | POA: Diagnosis not present

## 2021-11-03 DIAGNOSIS — I77810 Thoracic aortic ectasia: Secondary | ICD-10-CM | POA: Diagnosis not present

## 2021-11-03 NOTE — Assessment & Plan Note (Signed)
Blood pressure is well controlled.  Continue amlodipine 5 mg daily, metoprolol succinate 25 mg daily.

## 2021-11-03 NOTE — Assessment & Plan Note (Signed)
Heart rate seems to be well controlled.  He is tolerating anticoagulation.  In 9/22, hemoglobin was 13.6 and creatinine 0.99.  His weight is over 60 kg.  Continue apixaban 5 mg twice daily.

## 2021-11-03 NOTE — Progress Notes (Signed)
Cardiology Office Note:    Date:  11/03/2021   ID:  Mark Rivas, DOB [birth year], MRN [id number]  PCP:  Lorene Dy, MD  [facility] Providers Cardiologist:  Sherren Mocha, MD Cardiology APP:  Sharmon Revere    Referring MD: Lorene Dy, MD   Chief Complaint:  Follow-up for atrial fibrillation, mitral regurgitation    Patient Profile: Atrial flutter S/p RFCA in 07/2009 Permanent atrial fibrillation Hx of multiple cardioversions in 2010 Coumadin Rx (followed by [facility]) Echo 3/19: EF 55-60, PASP 39 Mitral regurgitation Echo 10/22: Moderate to severe MR, EF 55-60 Orthostatic hypotension Amlodipine reduced in the past Sick sinus syndrome Borderline diabetes Hyperlipidemia BPPV Dilated ascending aorta Echo 10/22: 40   Prior CV Studies: Echocardiogram 06/22/2021 EF 55-60, no RWMA, moderate LVH, mild reduced RVSF, normal PASP, moderate LAE, mild RAE, moderate to severe MR, trivial AI, mild dilation of ascending aorta (40 mm)  Cardiac Monitor 06/2020 1. Atrial fibrillation is present througout with an average HR of 69 (40-145) bpm 2. No high-grade heart block or pathologic pauses 3. There are rare PVC's with no sustained ventricular arrhythmia 4. There are periods of slow atrial fib during usual sleep hours, with longest R-R of 3.2 seconds    Echo 11/28/17 Mild LVH, EF 55-60, no RWMA, aortic root 38, MAC, trivial MR, mod LAE, normal RVSF, PASP 39 (mild pul HTN)   Echo 4/10 Mild to mod LVH, EF 02-63%, normal diastolic function, mild to mod LAE, mild Ao sclerosis, trivial AI, mild MR, mild TR, mild Pulmo HTN (RVSP 44 mmHg)   History of Present Illness:   Mark Rivas is a 88 y.o. male with the above problem list.  He was last seen in 9/22.  A follow-up echocardiogram demonstrated moderate to severe mitral regurgitation.  I referred him to Dr. Burt Knack with the structural heart clinic.  It was felt that he has 3+ MR, likely atrial  functional MR with a component of degenerative change with mitral annular calcification.  The patient was felt to be minimally limited.  His MR could be better characterized by TEE but it was not felt this would change his management.  The patient preferred conservative management.  The plan is to continue clinical follow-up with a repeat echocardiogram in 1 year.    He returns for follow-up.  He is here today with his wife.  Over the past month, he has just not felt well.  He noticed some shortness of breath with certain activities.  He has not felt like walking, etc.  He does have a lot of issues with allergies.  His vertigo has been worse.  He has not had syncope, chest pain, orthopnea, leg edema.  His weights are stable.        Past Medical History:  Diagnosis Date   Allergic rhinitis    Borderline diabetes    Chronic atrial fibrillation ([place])    Zio monitor 10/21: AFib, avg HR 69, 2 pauses (likely during sleep) longest 3.2 sec; no high grade HB or pathologic pauses.   Dyslipidemia    Gout    History of anemia    History of echocardiogram    Echo 3/19: Mild LVH, EF 55-60, normal wall motion, mildly dilated aortic root (38 mm), MAC, trivial MR, moderate LAE, normal RV SF, mild RAE, PASP 39   Hypercholesterolemia    Macular degeneration    Mitral regurgitation    Echo 10/22: EF 55-60, no RWMA, moderate concentric LVH, mildly  reduced RVSF, moderate LAE, mild RAE, moderate-severe MR, trivial AI, ascending aorta 40 mm   PUD (peptic ulcer disease)    hx of pyloroplasty    Current Medications: Current Meds  Medication Sig   amLODipine (NORVASC) 5 MG tablet Take 5 mg by mouth daily.   apixaban (ELIQUIS) 5 MG TABS tablet TAKE 1 TABLET BY MOUTH TWICE A DAY   cephALEXin (KEFLEX) 250 MG capsule Take 250 mg by mouth 4 (four) times daily.   doxepin (SINEQUAN) 50 MG capsule Take 50 mg by mouth daily.   fexofenadine (ALLEGRA) 180 MG tablet Take 180 mg by mouth daily.   meclizine (ANTIVERT) 25 MG  tablet Take 25 mg by mouth 3 (three) times daily as needed for dizziness (for dizziness).   metoprolol succinate (TOPROL-XL) 25 MG 24 hr tablet TAKE 1 TABLET BY MOUTH EVERY DAY   Omega-3 Fatty Acids (FISH OIL) 1000 MG CAPS Take 2,000 mg by mouth 2 (two) times daily.   simvastatin (ZOCOR) 20 MG tablet Take 20 mg by mouth at bedtime.    Allergies:   Patient has no known allergies.   Social History   Tobacco Use   Smoking status: Former    Types: Cigarettes    Quit date: 09/14/1975    Years since quitting: 46.1   Smokeless tobacco: Former  Scientific laboratory technician Use: Never used  Substance Use Topics   Alcohol use: Never   Drug use: Never    Family Hx: The patient's family history includes Heart attack in his mother; Heart disease in his father and sister.  Review of Systems  Constitutional: Positive for malaise/fatigue.  Respiratory:  Positive for shortness of breath.   Gastrointestinal:  Negative for hematochezia.  Genitourinary:  Negative for hematuria.  Neurological:  Positive for vertigo.    EKGs/Labs/Other Test Reviewed:    EKG:  EKG is not ordered today.  The ekg ordered today demonstrates n/a  Recent Labs: 06/08/2021: BUN 19; Creatinine, Ser 0.99; Hemoglobin 13.6; NT-Pro BNP 853; Platelets 158; Potassium 4.7; Sodium 142   Recent Lipid Panel No results for input(s): CHOL, TRIG, HDL, VLDL, LDLCALC, LDLDIRECT in the last 8760 hours.   Risk Assessment/Calculations:    CHA2DS2-VASc Score = 4   This indicates a 4.8% annual risk of stroke. The patient's score is based upon: CHF History: 0 HTN History: 1 Diabetes History: 1 Stroke History: 0 Vascular Disease History: 0 Age Score: 2 Gender Score: 0        Physical Exam:    VS:  BP 108/78    Pulse (!) 54    Resp 16    Ht _0  (1.88 m)    Wt 195 lb (88.5 kg)    SpO2 94%    BMI 25.04 kg/m     Wt Readings from Last 3 Encounters:  11/03/21 195 lb (88.5 kg)  07/09/21 194 lb (88 kg)  06/08/21 194 lb 6.4 oz (88.2 kg)     Constitutional:      Appearance: Healthy appearance. Not in distress.  Neck:     Vascular: No JVR. JVD normal.  Pulmonary:     Effort: Pulmonary effort is normal.     Breath sounds: No wheezing. No rales.  Cardiovascular:     Normal rate. Irregularly irregular rhythm. Normal S1. Normal S2.      Murmurs: There is no murmur.  Edema:    Peripheral edema absent.  Abdominal:     Palpations: Abdomen is soft.  Skin:  General: Skin is warm and dry.  Neurological:     General: No focal deficit present.     Mental Status: Alert and oriented to person, place and time.     Cranial Nerves: Cranial nerves are intact.        ASSESSMENT & PLAN:   SOB (shortness of breath) He has some mild shortness of breath with certain activities.  Overall, his main complaint is general malaise.  He has not felt like doing his usual walks.  He notes some recent URI type symptoms.  Given his advanced age, he could have fairly vague symptoms with pneumonia.  His exam does not suggest volume overload.  His symptoms do not suggest symptomatic mitral regurgitation.  I will obtain a CMET, CBC with differential and chest x-ray.  If his work-up is unremarkable and his symptoms continue, I have encouraged him to follow-up sooner with primary care.  Nonrheumatic mitral valve regurgitation Moderate to severe mitral regurgitation.  He has seen Dr. Burt Knack with structural heart clinic.  Plans are for conservative therapy at this time.  He has been felt to be minimally limited by his valvular heart disease.  We will plan for follow-up echocardiogram in October with follow-up visit at that time.  Ascending aorta dilation (HCC) 40 mm by echocardiogram in 10/22.  Plan follow-up echocardiogram in 06/2022.  Essential hypertension Blood pressure is well controlled.  Continue amlodipine 5 mg daily, metoprolol succinate 25 mg daily.  Permanent atrial fibrillation (HCC) Heart rate seems to be well controlled.  He is tolerating  anticoagulation.  In 9/22, hemoglobin was 13.6 and creatinine 0.99.  His weight is over 60 kg.  Continue apixaban 5 mg twice daily.           Dispo:  Return in about 8 months (around 07/03/2022) for Routine follow up in 8 months after echo with Richardson Dopp, PA-C..   Medication Adjustments/Labs and Tests Ordered: Current medicines are reviewed at length with the patient today.  Concerns regarding medicines are outlined above.  Tests Ordered: Orders Placed This Encounter  Procedures   DG Chest 2 View   Comp Met (CMET)   CBC w/Diff   ECHOCARDIOGRAM COMPLETE   Medication Changes: No orders of the defined types were placed in this encounter.  Signed, Richardson Dopp, PA-C  11/03/2021 5:21 PM    [facility] [place], [place], [place]  [address] Phone: [phone]; Fax: [phone]

## 2021-11-03 NOTE — Assessment & Plan Note (Signed)
Moderate to severe mitral regurgitation.  He has seen Dr. Burt Knack with structural heart clinic.  Plans are for conservative therapy at this time.  He has been felt to be minimally limited by his valvular heart disease.  We will plan for follow-up echocardiogram in October with follow-up visit at that time.

## 2021-11-03 NOTE — Patient Instructions (Signed)
Medication Instructions:    Your physician recommends that you continue on your current medications as directed. Please refer to the Current Medication list given to you today.  *If you need a refill on your cardiac medications before your next appointment, please call your pharmacy*   Lab Work:  TODAY!!!!! CBC/W DIFF/CMET  If you have labs (blood work) drawn today and your tests are completely normal, you will receive your results only by: MyChart Message (if you have MyChart) OR A paper copy in the mail If you have any lab test that is abnormal or we need to change your treatment, we will call you to review the results.   Testing/Procedures:   A chest x-ray takes a picture of the organs and structures inside the chest, including the heart, lungs, and blood vessels. This test can show several things, including, whether the heart is enlarges; whether fluid is building up in the lungs; and whether pacemaker / defibrillator leads are still in place.    Your physician has requested that you have an echocardiogram. Echocardiography is a painless test that uses sound waves to create images of your heart. It provides your doctor with information about the size and shape of your heart and how well your hearts chambers and valves are working. This procedure takes approximately one hour. There are no restrictions for this procedure.    Follow-Up: At [facility], you and your health needs are our priority.  As part of our continuing mission to provide you with exceptional heart care, we have created designated Provider Care Teams.  These Care Teams include your primary Cardiologist (physician) and Advanced Practice Providers (APPs -  Physician Assistants and Nurse Practitioners) who all work together to provide you with the care you need, when you need it.  We recommend signing up for the patient portal called "MyChart".  Sign up information is provided on this After Visit Summary.  MyChart  is used to connect with patients for Virtual Visits (Telemedicine).  Patients are able to view lab/test results, encounter notes, upcoming appointments, etc.  Non-urgent messages can be sent to your provider as well.   To learn more about what you can do with MyChart, go to [email or web address].    Your next appointment:   8 month(s)  The format for your next appointment:   In Person  Provider:   Tereso Newcomer, PA-C         Other Instructions  Your physician wants you to follow-up in: 8 months with Tereso Newcomer, PA-C after echo. You will receive a reminder letter in the mail two months in advance. If you don't receive a letter, please call our office to schedule the follow-up appointment.

## 2021-11-03 NOTE — Assessment & Plan Note (Signed)
He has some mild shortness of breath with certain activities.  Overall, his main complaint is general malaise.  He has not felt like doing his usual walks.  He notes some recent URI type symptoms.  Given his advanced age, he could have fairly vague symptoms with pneumonia.  His exam does not suggest volume overload.  His symptoms do not suggest symptomatic mitral regurgitation.  I will obtain a CMET, CBC with differential and chest x-ray.  If his work-up is unremarkable and his symptoms continue, I have encouraged him to follow-up sooner with primary care.

## 2021-11-03 NOTE — Assessment & Plan Note (Signed)
40 mm by echocardiogram in 10/22.  Plan follow-up echocardiogram in 06/2022.

## 2021-11-04 LAB — CBC WITH DIFFERENTIAL/PLATELET
Basophils Absolute: 0 10*3/uL (ref 0.0–0.2)
Basos: 0 %
EOS (ABSOLUTE): 0.1 10*3/uL (ref 0.0–0.4)
Eos: 1 %
Hematocrit: 40.9 % (ref 37.5–51.0)
Hemoglobin: 13.7 g/dL (ref 13.0–17.7)
Immature Grans (Abs): 0 10*3/uL (ref 0.0–0.1)
Immature Granulocytes: 0 %
Lymphocytes Absolute: 2 10*3/uL (ref 0.7–3.1)
Lymphs: 33 %
MCH: 31.1 pg (ref 26.6–33.0)
MCHC: 33.5 g/dL (ref 31.5–35.7)
MCV: 93 fL (ref 79–97)
Monocytes Absolute: 0.5 10*3/uL (ref 0.1–0.9)
Monocytes: 8 %
Neutrophils Absolute: 3.4 10*3/uL (ref 1.4–7.0)
Neutrophils: 58 %
Platelets: 161 10*3/uL (ref 150–450)
RBC: 4.41 x10E6/uL (ref 4.14–5.80)
RDW: 12.7 % (ref 11.6–15.4)
WBC: 5.9 10*3/uL (ref 3.4–10.8)

## 2021-11-04 LAB — COMPREHENSIVE METABOLIC PANEL
ALT: 14 IU/L (ref 0–44)
AST: 21 IU/L (ref 0–40)
Albumin/Globulin Ratio: 1.5 (ref 1.2–2.2)
Albumin: 4.1 g/dL (ref 3.5–4.6)
Alkaline Phosphatase: 92 IU/L (ref 44–121)
BUN/Creatinine Ratio: 17 (ref 10–24)
BUN: 18 mg/dL (ref 10–36)
Bilirubin Total: 0.6 mg/dL (ref 0.0–1.2)
CO2: 22 mmol/L (ref 20–29)
Calcium: 9.7 mg/dL (ref 8.6–10.2)
Chloride: 105 mmol/L (ref 96–106)
Creatinine, Ser: 1.04 mg/dL (ref 0.76–1.27)
Globulin, Total: 2.8 g/dL (ref 1.5–4.5)
Glucose: 89 mg/dL (ref 70–99)
Potassium: 4.4 mmol/L (ref 3.5–5.2)
Sodium: 142 mmol/L (ref 134–144)
Total Protein: 6.9 g/dL (ref 6.0–8.5)
eGFR: 68 mL/min/{1.73_m2} (ref >59)

## 2022-03-11 ENCOUNTER — Other Ambulatory Visit: Payer: Self-pay | Admitting: Physician Assistant

## 2022-03-11 DIAGNOSIS — I4821 Permanent atrial fibrillation: Secondary | ICD-10-CM

## 2022-03-11 NOTE — Telephone Encounter (Signed)
Prescription refill request for Eliquis received. Indication:Afib  Last office visit: 11/03/21 Mark Rivas)  Scr: 1.04 (11/03/21) Age: 88 Weight: 88.5kg  Appropriate dose and refill sent to requested pharmacy.

## 2022-05-18 IMAGING — CR DG CHEST 2V
2 series · 2 of 2 positions shown · non-contrast
Comparison: Chest x-ray 01/01/2015.

CLINICAL DATA: Cough.

EXAM:
CHEST - 2 VIEW

[w chest pa]
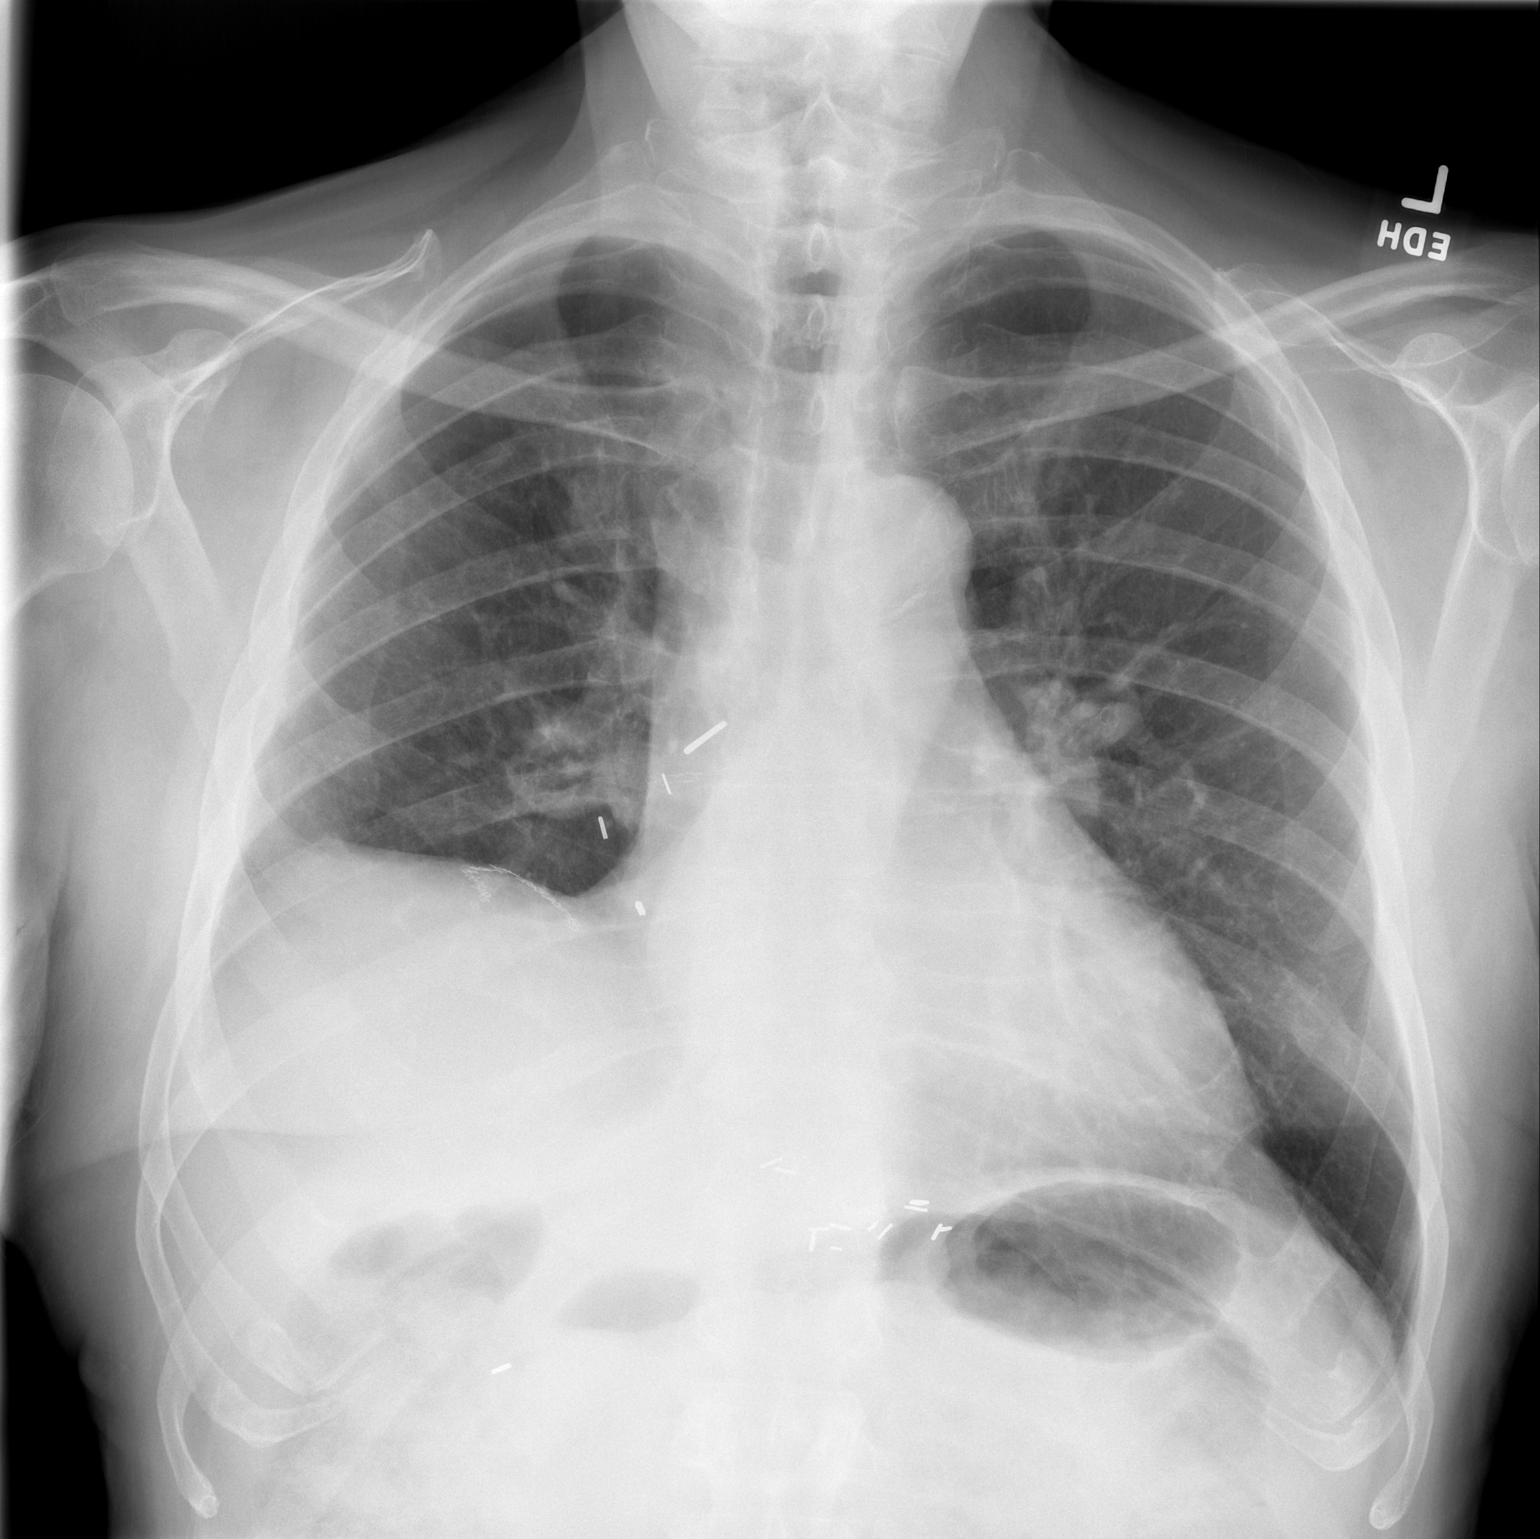

[w chest lat]
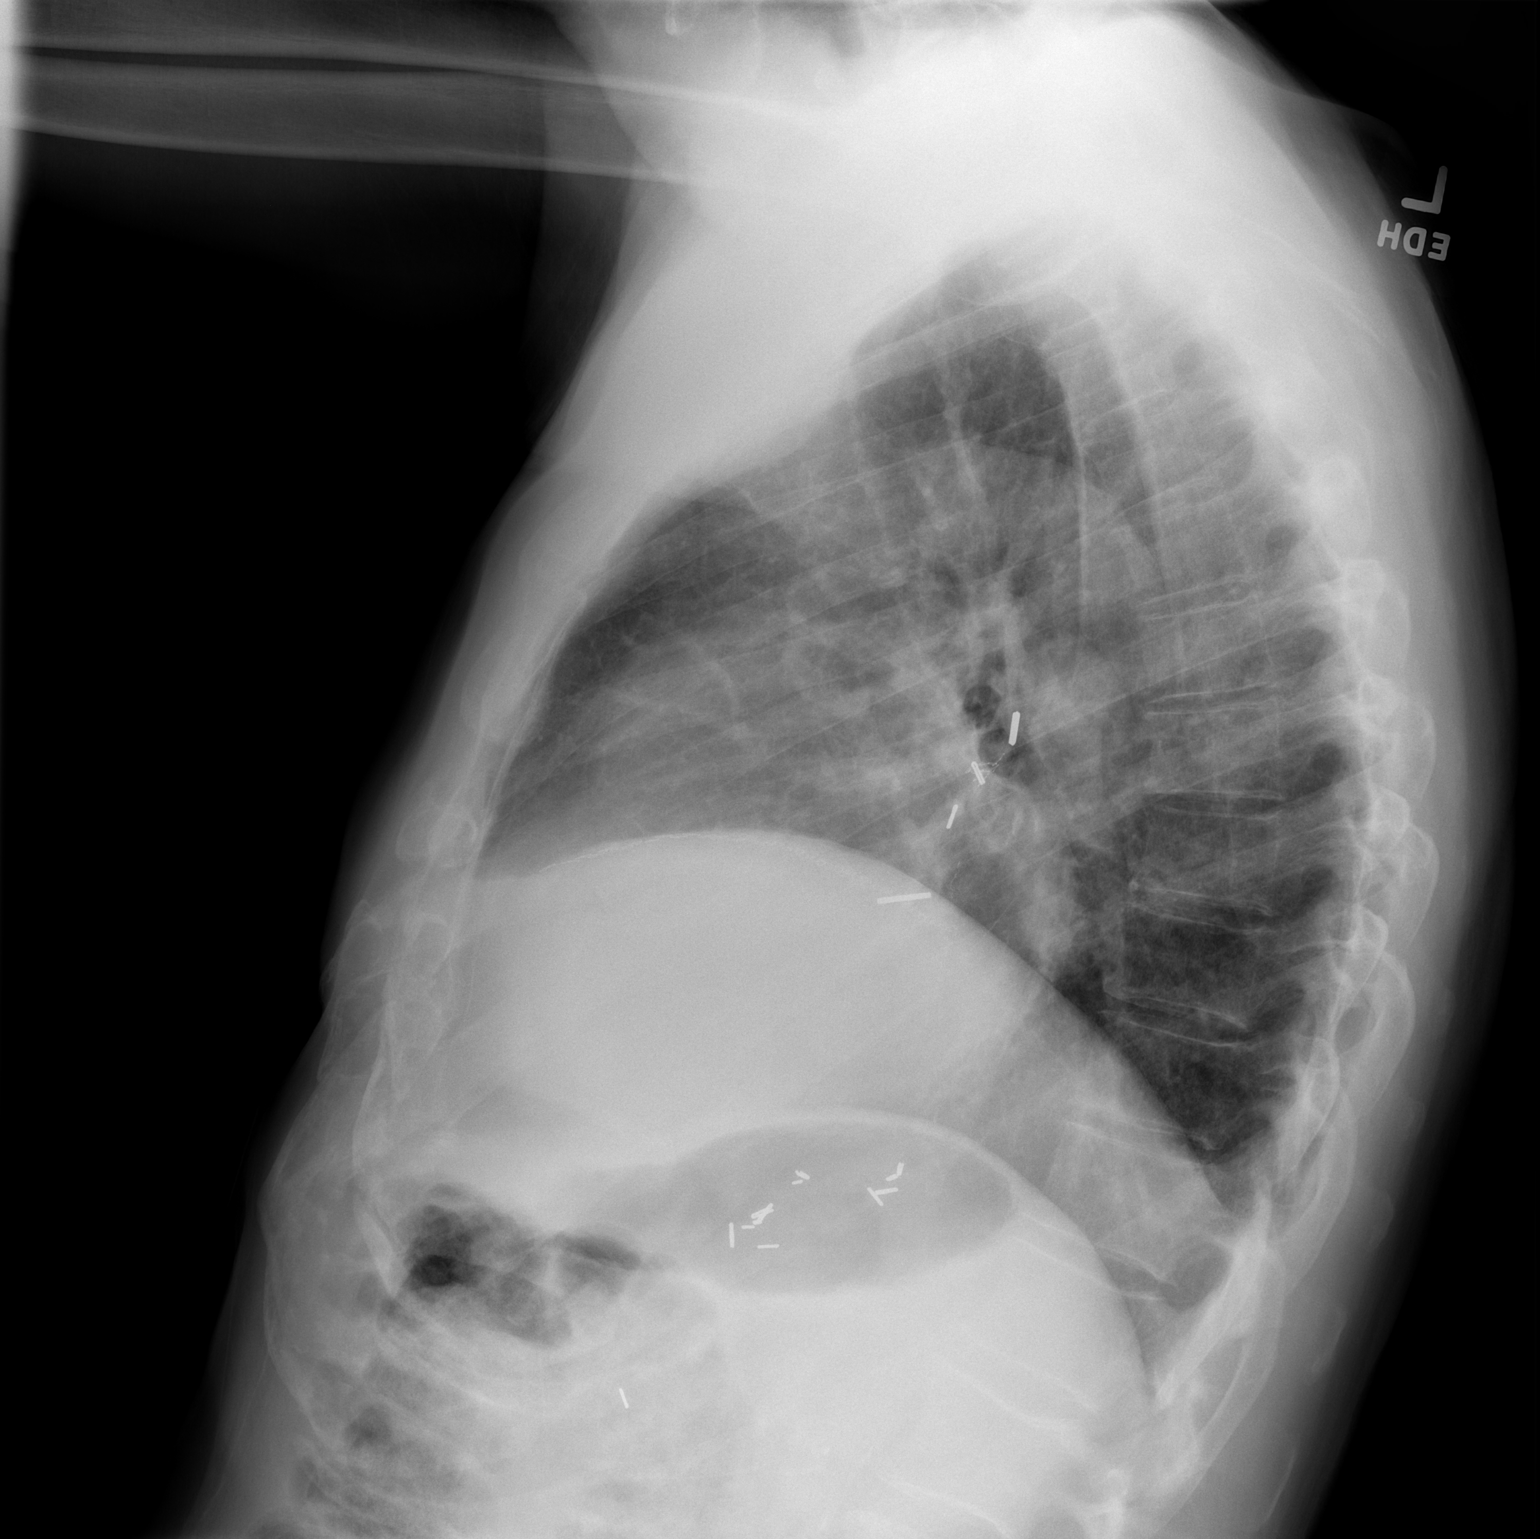

[2 of 2 positions shown; findings below may reference images not displayed]

FINDINGS: Mediastinum and hilar structures normal. Surgical clips noted over
the chest and abdomen. Heart size normal. Lungs are clear. Stable
elevation right hemidiaphragm. No acute bony abnormality identified.
IMPRESSION: No acute cardiopulmonary disease.

## 2022-06-16 ENCOUNTER — Ambulatory Visit (HOSPITAL_COMMUNITY): Payer: Medicare Other | Attending: Cardiology

## 2022-06-16 DIAGNOSIS — I10 Essential (primary) hypertension: Secondary | ICD-10-CM | POA: Insufficient documentation

## 2022-06-16 DIAGNOSIS — I4821 Permanent atrial fibrillation: Secondary | ICD-10-CM | POA: Diagnosis present

## 2022-06-16 DIAGNOSIS — I340 Nonrheumatic mitral (valve) insufficiency: Secondary | ICD-10-CM | POA: Diagnosis present

## 2022-06-16 DIAGNOSIS — I77810 Thoracic aortic ectasia: Secondary | ICD-10-CM | POA: Insufficient documentation

## 2022-06-16 DIAGNOSIS — R0602 Shortness of breath: Secondary | ICD-10-CM | POA: Diagnosis present

## 2022-06-17 LAB — ECHOCARDIOGRAM COMPLETE
Calc EF: 62.4 %
S' Lateral: 3.3 cm
Single Plane A2C EF: 67.6 %
Single Plane A4C EF: 59.3 %

## 2022-06-22 ENCOUNTER — Telehealth: Payer: Self-pay | Admitting: Physician Assistant

## 2022-06-22 NOTE — Telephone Encounter (Signed)
Pt and wife, Izora Gala, have been made aware that his Echocardiogram report is in the providers basket, and provider is in clinic and when he has a chance to result it, someone will call them back with the results.

## 2022-06-22 NOTE — Telephone Encounter (Signed)
Follow Up:     Patient wants to know if his Echo results are ready please?

## 2022-06-24 ENCOUNTER — Encounter: Payer: Self-pay | Admitting: Physician Assistant

## 2022-07-06 ENCOUNTER — Ambulatory Visit: Payer: Medicare Other | Attending: Physician Assistant | Admitting: Physician Assistant

## 2022-07-06 ENCOUNTER — Encounter: Payer: Self-pay | Admitting: Physician Assistant

## 2022-07-06 VITALS — BP 120/60 | HR 65 | Ht 74.0 in | Wt 199.6 lb

## 2022-07-06 DIAGNOSIS — I10 Essential (primary) hypertension: Secondary | ICD-10-CM | POA: Diagnosis not present

## 2022-07-06 DIAGNOSIS — I4821 Permanent atrial fibrillation: Secondary | ICD-10-CM

## 2022-07-06 DIAGNOSIS — I340 Nonrheumatic mitral (valve) insufficiency: Secondary | ICD-10-CM | POA: Diagnosis not present

## 2022-07-06 DIAGNOSIS — E782 Mixed hyperlipidemia: Secondary | ICD-10-CM | POA: Diagnosis not present

## 2022-07-06 DIAGNOSIS — I77810 Thoracic aortic ectasia: Secondary | ICD-10-CM

## 2022-07-06 NOTE — Assessment & Plan Note (Signed)
Managed by primary care. 

## 2022-07-06 NOTE — Assessment & Plan Note (Signed)
BP is controlled. Continue Amlodipine 5 mg once daily, Toprol XL 25 mg once daily.

## 2022-07-06 NOTE — Patient Instructions (Signed)
Medication Instructions:  Your physician recommends that you continue on your current medications as directed. Please refer to the Current Medication list given to you today.  *If you need a refill on your cardiac medications before your next appointment, please call your pharmacy*   Lab Work: TODAY:  BMET & CBC  If you have labs (blood work) drawn today and your tests are completely normal, you will receive your results only by: [place] (if you have MyChart) OR A paper copy in the mail If you have any lab test that is abnormal or we need to change your treatment, we will call you to review the results.   Testing/Procedures: Your physician has requested that you have a TEE. During a TEE, sound waves are used to create images of your heart. It provides your doctor with information about the size and shape of your heart and how well your heart's chambers and valves are working. In this test, a transducer is attached to the end of a flexible tube that's guided down your throat and into your esophagus (the tube leading from you mouth to your stomach) to get a more detailed image of your heart. You are not awake for the procedure. Please see the instruction sheet given to you BELOW:   Dear Mark Rivas You are scheduled for a TEE/Cardioversion/TEE Cardioversion on 07/13/22 with Dr. Radford Pax.  Please arrive at the [facility] (Main Entrance A) at [facility]: [address] [place], [place] [address] at 10:00 am  DIET: Nothing to eat or drink after midnight except a sip of water with medications (see medication instructions below)  FYI: For your safety, and to allow [place] to monitor your vital signs accurately during the surgery/procedure we request that   if you have artificial nails, gel coating, SNS etc. Please have those removed prior to your surgery/procedure. Not having the nail coverings /polish removed may result in cancellation or delay of your surgery/procedure.   Medication  Instructions:  Continue your anticoagulant: ELIQUIS You will need to continue your anticoagulant after your procedure until you  are told by your  Provider that it is safe to stop   You must have a responsible person to drive you home and stay in the waiting area during your procedure. Failure to do so could result in cancellation.  Bring your insurance cards.  *Special Note: Every effort is made to have your procedure done on time. Occasionally there are emergencies that occur at the hospital that may cause delays. Please be patient if a delay does occu    Follow-Up: At [facility], you and your health needs are our priority.  As part of our continuing mission to provide you with exceptional heart care, we have created designated Provider Care Teams.  These Care Teams include your primary Cardiologist (physician) and Advanced Practice Providers (APPs -  Physician Assistants and Nurse Practitioners) who all work together to provide you with the care you need, when you need it.  We recommend signing up for the patient portal called "MyChart".  Sign up information is provided on this After Visit Summary.  MyChart is used to connect with patients for Virtual Visits (Telemedicine).  Patients are able to view lab/test results, encounter notes, upcoming appointments, etc.  Non-urgent messages can be sent to your provider as well.   To learn more about what you can do with MyChart, go to [email or web address].    Your next appointment:   6 month(s)  The format for  your next appointment:   In Person  Provider:   Richardson Dopp, PA-C         Other Instructions   Important Information About Sugar          r.

## 2022-07-06 NOTE — Assessment & Plan Note (Signed)
43 mm on echocardiogram earlier this month.  We will continue to follow this with serial echocardiograms.  If his aorta gets larger, we will need to consider CT versus MRA.

## 2022-07-06 NOTE — Progress Notes (Signed)
Cardiology Office Note:    Date:  07/06/2022   ID:  Mark Rivas, DOB 1927/12/28, MRN [id number]  PCP:  Lorene Dy, [place] Providers Cardiologist:  Sherren Mocha, MD Cardiology APP:  Sharmon Revere    Referring MD: Lorene Dy, MD   Chief Complaint:  F/u for AFib, Mitral Regurg    Patient Profile: Atrial flutter S/p RFCA in 07/2009 Permanent atrial fibrillation Hx of multiple cardioversions in 2010 Coumadin Rx (followed by [facility]) Mitral regurgitation Echo 10/22: Moderate to severe MR, EF 55-60 Echo 10/23: EF 55-60, moderate to severe MR Orthostatic hypotension Amlodipine reduced in the past Sick sinus syndrome Borderline diabetes Hyperlipidemia BPPV Dilated ascending aorta Echo 10/22: 40 Echo 10/23: 43 mm  Prior CV Studies: ECHO COMPLETE WO IMAGING ENHANCING AGENT 06/16/2022 Echocardiogram 06/2022: EF 55-60, no RWMA, mod conc LVH, low normal RVSF, severe LAE, mild to mod RAE, mod to severe MR, trivial AI, AV sclerosis w/o AS, mild dilation of ascending aorta (43 mm)    Echocardiogram 06/22/2021 EF 55-60, no RWMA, moderate LVH, mild reduced RVSF, normal PASP, moderate LAE, mild RAE, moderate to severe MR, trivial AI, mild dilation of ascending aorta (40 mm)   Cardiac Monitor 06/2020 1. Atrial fibrillation is present througout with an average HR of 69 (40-145) bpm 2. No high-grade heart block or pathologic pauses 3. There are rare PVC's with no sustained ventricular arrhythmia 4. There are periods of slow atrial fib during usual sleep hours, with longest R-R of 3.2 seconds    Echo 11/28/17 Mild LVH, EF 55-60, no RWMA, aortic root 38, MAC, trivial MR, mod LAE, normal RVSF, PASP 39 (mild pul HTN)   Echo 4/10 Mild to mod LVH, EF 03-50%, normal diastolic function, mild to mod LAE, mild Ao sclerosis, trivial AI, mild MR, mild TR, mild Pulmo HTN (RVSP 44 mmHg)  History of Present Illness:   Mark Rivas is a 88  y.o. male with the above problem list.  He was last seen in February 2023.  He had previously been seen by Dr. Burt Knack for evaluation of moderate to severe mitral regurgitation.  His mitral regurgitation was felt to likely be functional MR with a component of degenerative change and mitral annular calcification.  He was minimally limited.  At that time, the patient preferred conservative management and deferred proceeding with transesophageal echocardiogram.  At last visit, we arranged a follow-up echocardiogram to continue to monitor his mitral regurgitation.  This was recently done 06/16/2022 and demonstrated normal ejection fraction and moderate to severe mitral regurgitation.  Transesophageal echocardiogram would better characterize his mitral regurgitation.  He is brought back today to review the results of his echocardiogram and determine whether or not to proceed with TEE.    He is here with his wife today.  He has a lot of allergy symptoms last few days.  He is not feeling well today.  Otherwise, he has not had any significant shortness of breath, chest pain, orthopnea, leg edema.  He has not had any syncope.  He remains active at home.     Past Medical History:  Diagnosis Date   Allergic rhinitis    Borderline diabetes    Chronic atrial fibrillation ([place])    Zio monitor 10/21: AFib, avg HR 69, 2 pauses (likely during sleep) longest 3.2 sec; no high grade HB or pathologic pauses.   Dyslipidemia    Gout    History of anemia    History of  echocardiogram    Echo 3/19: Mild LVH, EF 55-60, normal wall motion, mildly dilated aortic root (38 mm), MAC, trivial MR, moderate LAE, normal RV SF, mild RAE, PASP 39   Hypercholesterolemia    Macular degeneration    Mitral regurgitation    Echo 10/22: EF 55-60, no RWMA, moderate concentric LVH, mildly reduced RVSF, moderate LAE, mild RAE, moderate-severe MR, trivial AI, ascending aorta 40 mm // Echocardiogram 06/2022: EF 55-60, no RWMA, mod conc LVH, low  normal RVSF, severe LAE, mild to mod RAE, mod to severe MR, trivial AI, AV sclerosis w/o AS, mild dilation of ascending aorta (43 mm)   PUD (peptic ulcer disease)    hx of pyloroplasty    Current Medications: Current Meds  Medication Sig   amLODipine (NORVASC) 5 MG tablet Take 5 mg by mouth daily.   apixaban (ELIQUIS) 5 MG TABS tablet TAKE 1 TABLET BY MOUTH TWICE A DAY   doxepin (SINEQUAN) 50 MG capsule Take 50 mg by mouth daily.   fexofenadine (ALLEGRA) 180 MG tablet Take 180 mg by mouth daily.   meclizine (ANTIVERT) 25 MG tablet Take 25 mg by mouth 3 (three) times daily as needed for dizziness (for dizziness).   metoprolol succinate (TOPROL-XL) 25 MG 24 hr tablet TAKE 1 TABLET BY MOUTH EVERY DAY   Omega-3 Fatty Acids (FISH OIL) 1000 MG CAPS Take 2,000 mg by mouth 2 (two) times daily.   simvastatin (ZOCOR) 20 MG tablet Take 20 mg by mouth at bedtime.    Allergies:   Patient has no known allergies.   Social History   Tobacco Use   Smoking status: Former    Types: Cigarettes    Quit date: 09/14/1975    Years since quitting: 46.8   Smokeless tobacco: Former  Scientific laboratory technician Use: Never used  Substance Use Topics   Alcohol use: Never   Drug use: Never    Family Hx: The patient's family history includes Heart attack in his mother; Heart disease in his father and sister.  Review of Systems  Constitutional: Negative for fever.  Gastrointestinal:  Negative for hematochezia.  Genitourinary:  Negative for hematuria.     EKGs/Labs/Other Test Reviewed:    EKG:  EKG is   ordered today.  The ekg ordered today demonstrates atrial fibrillation, HR 65, normal axis, nonspecific ST-T wave changes, QTc 420, no change from prior tracing  Recent Labs: 11/03/2021: ALT 14; BUN 18; Creatinine, Ser 1.04; Hemoglobin 13.7; Platelets 161; Potassium 4.4; Sodium 142   Recent Lipid Panel No results for input(s): "CHOL", "TRIG", "HDL", "VLDL", "LDLCALC", "LDLDIRECT" in the last 8760 hours.   Risk  Assessment/Calculations/Metrics:    CHA2DS2-VASc Score = 4   This indicates a 4.8% annual risk of stroke. The patient's score is based upon: CHF History: 0 HTN History: 1 Diabetes History: 1 Stroke History: 0 Vascular Disease History: 0 Age Score: 2 Gender Score: 0             Physical Exam:    VS:  BP 120/60   Pulse 65   Ht _0  (1.88 m)   Wt 199 lb 9.6 oz (90.5 kg)   SpO2 96%   BMI 25.63 kg/m     Wt Readings from Last 3 Encounters:  07/06/22 199 lb 9.6 oz (90.5 kg)  11/03/21 195 lb (88.5 kg)  07/09/21 194 lb (88 kg)    Constitutional:      Appearance: Healthy appearance. Not in distress.  Neck:  Vascular: JVD normal.  Pulmonary:     Effort: Pulmonary effort is normal.     Breath sounds: No wheezing. No rales.  Cardiovascular:     Normal rate. Irregularly irregular rhythm. Normal S1. Normal S2.      Murmurs: There is no murmur.  Edema:    Peripheral edema absent.  Abdominal:     Palpations: Abdomen is soft.  Skin:    General: Skin is warm and dry.  Neurological:     General: No focal deficit present.     Mental Status: Alert and oriented to person, place and time.         ASSESSMENT & PLAN:   Nonrheumatic mitral valve regurgitation He continues to have severe MR on TTE. Overall, he does not seem to have symptoms. He notes that he remains very active. We discussed the benefits of proceeding with TEE to better characterize his MR. If MR is severe enough, I will get him f/u with Dr. Burt Knack to see if intervention is needed. We did briefly discuss the MitraClip procedure. We discussed the risks of a TEE as well. After a discussion using shared decision making, he is willing to proceed.   Permanent atrial fibrillation (HCC) Rate is controlled. Continue Toprol XL 25 mg once daily, Eliquis 5 mg twice daily (wt 90.5 kg, creatinine in Feb 2023 was 1.04). F/u in 6 mos.   Essential hypertension BP is controlled. Continue Amlodipine 5 mg once daily, Toprol XL 25  mg once daily.   Mixed hyperlipidemia Managed by primary care.   Ascending aorta dilation (HCC) 43 mm on echocardiogram earlier this month.  We will continue to follow this with serial echocardiograms.  If his aorta gets larger, we will need to consider CT versus MRA.        Shared Decision Making/Informed Consent The risks [esophageal damage, perforation (1:10,000 risk), bleeding, pharyngeal hematoma as well as other potential complications associated with conscious sedation including aspiration, arrhythmia, respiratory failure and death], benefits (treatment guidance and diagnostic support) and alternatives of a transesophageal echocardiogram were discussed in detail with Mr. Engelmann and he is willing to proceed.    Dispo:  Return in about 6 months (around 01/05/2023) for Routine Follow Up, w/ Richardson Dopp, PA-C.   Medication Adjustments/Labs and Tests Ordered: Current medicines are reviewed at length with the patient today.  Concerns regarding medicines are outlined above.  Tests Ordered: Orders Placed This Encounter  Procedures   Basic metabolic panel   CBC   EKG 12-Lead   Medication Changes: No orders of the defined types were placed in this encounter.  Signed, Richardson Dopp, PA-C  07/06/2022 3:38 PM    [address], [place], [place]  [address] Phone: [phone]; Fax: [phone]

## 2022-07-06 NOTE — H&P (View-Only) (Signed)
Cardiology Office Note:    Date:  07/06/2022   ID:  Mark Rivas, DOB Feb 18, 1928, MRN [id number]  PCP:  Lorene Dy, [place] Providers Cardiologist:  Sherren Mocha, MD Cardiology APP:  Sharmon Revere    Referring MD: Lorene Dy, MD   Chief Complaint:  F/u for AFib, Mitral Regurg    Patient Profile: Atrial flutter S/p RFCA in 07/2009 Permanent atrial fibrillation Hx of multiple cardioversions in 2010 Coumadin Rx (followed by [facility]) Mitral regurgitation Echo 10/22: Moderate to severe MR, EF 55-60 Echo 10/23: EF 55-60, moderate to severe MR Orthostatic hypotension Amlodipine reduced in the past Sick sinus syndrome Borderline diabetes Hyperlipidemia BPPV Dilated ascending aorta Echo 10/22: 40 Echo 10/23: 43 mm  Prior CV Studies: ECHO COMPLETE WO IMAGING ENHANCING AGENT 06/16/2022 Echocardiogram 06/2022: EF 55-60, no RWMA, mod conc LVH, low normal RVSF, severe LAE, mild to mod RAE, mod to severe MR, trivial AI, AV sclerosis w/o AS, mild dilation of ascending aorta (43 mm)    Echocardiogram 06/22/2021 EF 55-60, no RWMA, moderate LVH, mild reduced RVSF, normal PASP, moderate LAE, mild RAE, moderate to severe MR, trivial AI, mild dilation of ascending aorta (40 mm)   Cardiac Monitor 06/2020 1. Atrial fibrillation is present througout with an average HR of 69 (40-145) bpm 2. No high-grade heart block or pathologic pauses 3. There are rare PVC's with no sustained ventricular arrhythmia 4. There are periods of slow atrial fib during usual sleep hours, with longest R-R of 3.2 seconds    Echo 11/28/17 Mild LVH, EF 55-60, no RWMA, aortic root 38, MAC, trivial MR, mod LAE, normal RVSF, PASP 39 (mild pul HTN)   Echo 4/10 Mild to mod LVH, EF 38-25%, normal diastolic function, mild to mod LAE, mild Ao sclerosis, trivial AI, mild MR, mild TR, mild Pulmo HTN (RVSP 44 mmHg)  History of Present Illness:   Mark Rivas is a 88  y.o. male with the above problem list.  He was last seen in February 2023.  He had previously been seen by Dr. Burt Knack for evaluation of moderate to severe mitral regurgitation.  His mitral regurgitation was felt to likely be functional MR with a component of degenerative change and mitral annular calcification.  He was minimally limited.  At that time, the patient preferred conservative management and deferred proceeding with transesophageal echocardiogram.  At last visit, we arranged a follow-up echocardiogram to continue to monitor his mitral regurgitation.  This was recently done 06/16/2022 and demonstrated normal ejection fraction and moderate to severe mitral regurgitation.  Transesophageal echocardiogram would better characterize his mitral regurgitation.  He is brought back today to review the results of his echocardiogram and determine whether or not to proceed with TEE.    He is here with his wife today.  He has a lot of allergy symptoms last few days.  He is not feeling well today.  Otherwise, he has not had any significant shortness of breath, chest pain, orthopnea, leg edema.  He has not had any syncope.  He remains active at home.     Past Medical History:  Diagnosis Date   Allergic rhinitis    Borderline diabetes    Chronic atrial fibrillation ([place])    Zio monitor 10/21: AFib, avg HR 69, 2 pauses (likely during sleep) longest 3.2 sec; no high grade HB or pathologic pauses.   Dyslipidemia    Gout    History of anemia    History of  echocardiogram    Echo 3/19: Mild LVH, EF 55-60, normal wall motion, mildly dilated aortic root (38 mm), MAC, trivial MR, moderate LAE, normal RV SF, mild RAE, PASP 39   Hypercholesterolemia    Macular degeneration    Mitral regurgitation    Echo 10/22: EF 55-60, no RWMA, moderate concentric LVH, mildly reduced RVSF, moderate LAE, mild RAE, moderate-severe MR, trivial AI, ascending aorta 40 mm // Echocardiogram 06/2022: EF 55-60, no RWMA, mod conc LVH, low  normal RVSF, severe LAE, mild to mod RAE, mod to severe MR, trivial AI, AV sclerosis w/o AS, mild dilation of ascending aorta (43 mm)   PUD (peptic ulcer disease)    hx of pyloroplasty    Current Medications: Current Meds  Medication Sig   amLODipine (NORVASC) 5 MG tablet Take 5 mg by mouth daily.   apixaban (ELIQUIS) 5 MG TABS tablet TAKE 1 TABLET BY MOUTH TWICE A DAY   doxepin (SINEQUAN) 50 MG capsule Take 50 mg by mouth daily.   fexofenadine (ALLEGRA) 180 MG tablet Take 180 mg by mouth daily.   meclizine (ANTIVERT) 25 MG tablet Take 25 mg by mouth 3 (three) times daily as needed for dizziness (for dizziness).   metoprolol succinate (TOPROL-XL) 25 MG 24 hr tablet TAKE 1 TABLET BY MOUTH EVERY DAY   Omega-3 Fatty Acids (FISH OIL) 1000 MG CAPS Take 2,000 mg by mouth 2 (two) times daily.   simvastatin (ZOCOR) 20 MG tablet Take 20 mg by mouth at bedtime.    Allergies:   Patient has no known allergies.   Social History   Tobacco Use   Smoking status: Former    Types: Cigarettes    Quit date: 09/14/1975    Years since quitting: 46.8   Smokeless tobacco: Former  Scientific laboratory technician Use: Never used  Substance Use Topics   Alcohol use: Never   Drug use: Never    Family Hx: The patient's family history includes Heart attack in his mother; Heart disease in his father and sister.  Review of Systems  Constitutional: Negative for fever.  Gastrointestinal:  Negative for hematochezia.  Genitourinary:  Negative for hematuria.     EKGs/Labs/Other Test Reviewed:    EKG:  EKG is   ordered today.  The ekg ordered today demonstrates atrial fibrillation, HR 65, normal axis, nonspecific ST-T wave changes, QTc 420, no change from prior tracing  Recent Labs: 11/03/2021: ALT 14; BUN 18; Creatinine, Ser 1.04; Hemoglobin 13.7; Platelets 161; Potassium 4.4; Sodium 142   Recent Lipid Panel No results for input(s): "CHOL", "TRIG", "HDL", "VLDL", "LDLCALC", "LDLDIRECT" in the last 8760 hours.   Risk  Assessment/Calculations/Metrics:    CHA2DS2-VASc Score = 4   This indicates a 4.8% annual risk of stroke. The patient's score is based upon: CHF History: 0 HTN History: 1 Diabetes History: 1 Stroke History: 0 Vascular Disease History: 0 Age Score: 2 Gender Score: 0             Physical Exam:    VS:  BP 120/60   Pulse 65   Ht _0  (1.88 m)   Wt 199 lb 9.6 oz (90.5 kg)   SpO2 96%   BMI 25.63 kg/m     Wt Readings from Last 3 Encounters:  07/06/22 199 lb 9.6 oz (90.5 kg)  11/03/21 195 lb (88.5 kg)  07/09/21 194 lb (88 kg)    Constitutional:      Appearance: Healthy appearance. Not in distress.  Neck:  Vascular: JVD normal.  Pulmonary:     Effort: Pulmonary effort is normal.     Breath sounds: No wheezing. No rales.  Cardiovascular:     Normal rate. Irregularly irregular rhythm. Normal S1. Normal S2.      Murmurs: There is no murmur.  Edema:    Peripheral edema absent.  Abdominal:     Palpations: Abdomen is soft.  Skin:    General: Skin is warm and dry.  Neurological:     General: No focal deficit present.     Mental Status: Alert and oriented to person, place and time.         ASSESSMENT & PLAN:   Nonrheumatic mitral valve regurgitation He continues to have severe MR on TTE. Overall, he does not seem to have symptoms. He notes that he remains very active. We discussed the benefits of proceeding with TEE to better characterize his MR. If MR is severe enough, I will get him f/u with Dr. Burt Knack to see if intervention is needed. We did briefly discuss the MitraClip procedure. We discussed the risks of a TEE as well. After a discussion using shared decision making, he is willing to proceed.   Permanent atrial fibrillation (HCC) Rate is controlled. Continue Toprol XL 25 mg once daily, Eliquis 5 mg twice daily (wt 90.5 kg, creatinine in Feb 2023 was 1.04). F/u in 6 mos.   Essential hypertension BP is controlled. Continue Amlodipine 5 mg once daily, Toprol XL 25  mg once daily.   Mixed hyperlipidemia Managed by primary care.   Ascending aorta dilation (HCC) 43 mm on echocardiogram earlier this month.  We will continue to follow this with serial echocardiograms.  If his aorta gets larger, we will need to consider CT versus MRA.        Shared Decision Making/Informed Consent The risks [esophageal damage, perforation (1:10,000 risk), bleeding, pharyngeal hematoma as well as other potential complications associated with conscious sedation including aspiration, arrhythmia, respiratory failure and death], benefits (treatment guidance and diagnostic support) and alternatives of a transesophageal echocardiogram were discussed in detail with Mark Rivas and he is willing to proceed.    Dispo:  Return in about 6 months (around 01/05/2023) for Routine Follow Up, w/ Richardson Dopp, PA-C.   Medication Adjustments/Labs and Tests Ordered: Current medicines are reviewed at length with the patient today.  Concerns regarding medicines are outlined above.  Tests Ordered: Orders Placed This Encounter  Procedures   Basic metabolic panel   CBC   EKG 12-Lead   Medication Changes: No orders of the defined types were placed in this encounter.  Signed, Richardson Dopp, PA-C  07/06/2022 3:38 PM    [address], [place], [place]  [address] Phone: [phone]; Fax: [phone]

## 2022-07-06 NOTE — Assessment & Plan Note (Signed)
Rate is controlled. Continue Toprol XL 25 mg once daily, Eliquis 5 mg twice daily (wt 90.5 kg, creatinine in Feb 2023 was 1.04). F/u in 6 mos.

## 2022-07-06 NOTE — Assessment & Plan Note (Signed)
He continues to have severe MR on TTE. Overall, he does not seem to have symptoms. He notes that he remains very active. We discussed the benefits of proceeding with TEE to better characterize his MR. If MR is severe enough, I will get him f/u with Dr. Burt Knack to see if intervention is needed. We did briefly discuss the MitraClip procedure. We discussed the risks of a TEE as well. After a discussion using shared decision making, he is willing to proceed.

## 2022-07-07 LAB — CBC
Hematocrit: 40.3 % (ref 37.5–51.0)
Hemoglobin: 13.3 g/dL (ref 13.0–17.7)
MCH: 31 pg (ref 26.6–33.0)
MCHC: 33 g/dL (ref 31.5–35.7)
MCV: 94 fL (ref 79–97)
Platelets: 156 10*3/uL (ref 150–450)
RBC: 4.29 x10E6/uL (ref 4.14–5.80)
RDW: 12.5 % (ref 11.6–15.4)
WBC: 6.2 10*3/uL (ref 3.4–10.8)

## 2022-07-07 LAB — BASIC METABOLIC PANEL
BUN/Creatinine Ratio: 19 (ref 10–24)
BUN: 22 mg/dL (ref 10–36)
CO2: 20 mmol/L (ref 20–29)
Calcium: 9.3 mg/dL (ref 8.6–10.2)
Chloride: 105 mmol/L (ref 96–106)
Creatinine, Ser: 1.16 mg/dL (ref 0.76–1.27)
Glucose: 133 mg/dL — ABNORMAL HIGH (ref 70–99)
Potassium: 4.3 mmol/L (ref 3.5–5.2)
Sodium: 143 mmol/L (ref 134–144)
eGFR: 59 mL/min/{1.73_m2} — ABNORMAL LOW (ref >59)

## 2022-07-16 ENCOUNTER — Ambulatory Visit (HOSPITAL_COMMUNITY)
Admission: RE | Admit: 2022-07-16 | Discharge: 2022-07-16 | Disposition: A | Discharge status: Home / Self Care | Payer: Medicare Other | Attending: Internal Medicine | Admitting: Internal Medicine

## 2022-07-16 ENCOUNTER — Other Ambulatory Visit: Payer: Self-pay

## 2022-07-16 ENCOUNTER — Encounter (HOSPITAL_COMMUNITY)
Admission: RE | Disposition: A | Discharge status: Home / Self Care | Payer: Self-pay | Source: Home / Self Care | Attending: Internal Medicine

## 2022-07-16 ENCOUNTER — Ambulatory Visit (HOSPITAL_BASED_OUTPATIENT_CLINIC_OR_DEPARTMENT_OTHER)
Admission: RE | Admit: 2022-07-16 | Discharge: 2022-07-16 | Disposition: A | Discharge status: Home / Self Care | Payer: Medicare Other | Source: Ambulatory Visit | Attending: Physician Assistant | Admitting: Physician Assistant

## 2022-07-16 ENCOUNTER — Ambulatory Visit (HOSPITAL_COMMUNITY): Payer: Medicare Other | Admitting: Anesthesiology

## 2022-07-16 ENCOUNTER — Encounter (HOSPITAL_COMMUNITY): Payer: Self-pay | Admitting: Internal Medicine

## 2022-07-16 ENCOUNTER — Ambulatory Visit (HOSPITAL_BASED_OUTPATIENT_CLINIC_OR_DEPARTMENT_OTHER): Payer: Medicare Other | Admitting: Anesthesiology

## 2022-07-16 DIAGNOSIS — I4891 Unspecified atrial fibrillation: Secondary | ICD-10-CM | POA: Diagnosis not present

## 2022-07-16 DIAGNOSIS — I340 Nonrheumatic mitral (valve) insufficiency: Secondary | ICD-10-CM | POA: Diagnosis not present

## 2022-07-16 DIAGNOSIS — I700 Atherosclerosis of aorta: Secondary | ICD-10-CM | POA: Diagnosis not present

## 2022-07-16 DIAGNOSIS — I10 Essential (primary) hypertension: Secondary | ICD-10-CM | POA: Insufficient documentation

## 2022-07-16 DIAGNOSIS — I482 Chronic atrial fibrillation, unspecified: Secondary | ICD-10-CM | POA: Insufficient documentation

## 2022-07-16 DIAGNOSIS — Z87891 Personal history of nicotine dependence: Secondary | ICD-10-CM | POA: Insufficient documentation

## 2022-07-16 DIAGNOSIS — I080 Rheumatic disorders of both mitral and aortic valves: Secondary | ICD-10-CM | POA: Insufficient documentation

## 2022-07-16 HISTORY — PX: TEE WITHOUT CARDIOVERSION: SHX5443

## 2022-07-16 SURGERY — ECHOCARDIOGRAM, TRANSESOPHAGEAL
Anesthesia: Monitor Anesthesia Care

## 2022-07-16 MED ORDER — BUTAMBEN-TETRACAINE-BENZOCAINE 2-2-14 % EX AERO
INHALATION_SPRAY | CUTANEOUS | Status: DC | PRN
  Administered 2022-07-16: 2 via TOPICAL

## 2022-07-16 MED ORDER — PHENYLEPHRINE 80 MCG/ML (10ML) SYRINGE FOR IV PUSH (FOR BLOOD PRESSURE SUPPORT)
PREFILLED_SYRINGE | INTRAVENOUS | Status: DC | PRN
  Administered 2022-07-16 (×2): 80 ug via INTRAVENOUS
  Administered 2022-07-16: 160 ug via INTRAVENOUS
  Administered 2022-07-16 (×2): 80 ug via INTRAVENOUS
  Administered 2022-07-16: 160 ug via INTRAVENOUS

## 2022-07-16 MED ORDER — PROPOFOL 10 MG/ML IV BOLUS
INTRAVENOUS | Status: DC | PRN
  Administered 2022-07-16: 20 mg via INTRAVENOUS
  Administered 2022-07-16: 30 mg via INTRAVENOUS
  Administered 2022-07-16: 10 mg via INTRAVENOUS

## 2022-07-16 MED ORDER — PROPOFOL 500 MG/50ML IV EMUL
INTRAVENOUS | Status: DC | PRN
  Administered 2022-07-16: 125 ug/kg/min via INTRAVENOUS

## 2022-07-16 MED ORDER — GLYCOPYRROLATE PF 0.2 MG/ML IJ SOSY
PREFILLED_SYRINGE | INTRAMUSCULAR | Status: DC | PRN
  Administered 2022-07-16: .1 mg via INTRAVENOUS

## 2022-07-16 MED ORDER — SODIUM CHLORIDE 0.9 % IV SOLN: INTRAVENOUS | Status: DC

## 2022-07-16 NOTE — Progress Notes (Signed)
  Echocardiogram Echocardiogram Transesophageal has been performed.  Bobbye Charleston 07/16/2022, 1:10 PM

## 2022-07-16 NOTE — Anesthesia Procedure Notes (Addendum)
Procedure Name: General with mask airway Date/Time: 07/16/2022 12:24 PM  Performed by: Erick Colace, CRNAPre-anesthesia Checklist: Patient identified, Emergency Drugs available, Suction available and Patient being monitored Patient Re-evaluated:Patient Re-evaluated prior to induction Oxygen Delivery Method: Nasal cannula Preoxygenation: Pre-oxygenation with 100% oxygen Induction Type: IV induction Airway Equipment and Method: Bite block Dental Injury: Teeth and Oropharynx as per pre-operative assessment

## 2022-07-16 NOTE — CV Procedure (Signed)
    TRANSESOPHAGEAL ECHOCARDIOGRAM   NAME:  Mark Rivas    MRN: [id number] DOB:  07/23/1928    ADMIT DATE: 07/16/2022  INDICATIONS: Mitral regurgitation  PROCEDURE:   Informed consent was obtained prior to the procedure. The risks, benefits and alternatives for the procedure were discussed and the patient comprehended these risks.  Risks include, but are not limited to, cough, sore throat, vomiting, nausea, somnolence, esophageal and stomach trauma or perforation, bleeding, low blood pressure, aspiration, pneumonia, infection, trauma to the teeth and death.    Procedural time out performed. The oropharynx was anesthetized with topical 1% benzocaine.    Anesthesia was administered by Dr. Marcie Bal and team.  The patient's heart rate, blood pressure, and oxygen saturation are monitored continuously during the procedure. T The transesophageal probe was inserted in the esophagus and stomach without difficulty and multiple views were obtained.   COMPLICATIONS:    There were no immediate complications.  KEY FINDINGS:  Moderate mitral regurgitation .  Full report to follow. Further management per primary team.   Rudean Haskell, MD [place]  1:05 PM

## 2022-07-16 NOTE — Transfer of Care (Signed)
Immediate Anesthesia Transfer of Care Note  Patient: Mark Rivas  Procedure(s) Performed: TRANSESOPHAGEAL ECHOCARDIOGRAM (TEE)  Patient Location: Short Stay  Anesthesia Type:MAC  Level of Consciousness: drowsy  Airway & Oxygen Therapy: Patient Spontanous Breathing  Post-op Assessment: Report given to RN and Post -op Vital signs reviewed and stable  Post vital signs: Reviewed and stable  Last Vitals:  Vitals Value Taken Time  BP 98/69 07/16/22 1300  Temp    Pulse 71 07/16/22 1301  Resp 20 07/16/22 1301  SpO2 92 % 07/16/22 1301  Vitals shown include unvalidated device data.  Last Pain:  Vitals:   07/16/22 1124  TempSrc: Temporal  PainSc: 0-No pain         Complications: No notable events documented.

## 2022-07-16 NOTE — Anesthesia Preprocedure Evaluation (Signed)
Anesthesia Evaluation  Patient identified by MRN, date of birth, ID band Patient awake    Reviewed: Allergy & Precautions, H&P , NPO status , Patient's Chart, lab work & pertinent test results  Airway Mallampati: II   Neck ROM: full    Dental   Pulmonary former smoker   breath sounds clear to auscultation       Cardiovascular hypertension, + dysrhythmias Atrial Fibrillation + Valvular Problems/Murmurs MR  Rhythm:regular Rate:Normal     Neuro/Psych    GI/Hepatic PUD,,,  Endo/Other    Renal/GU      Musculoskeletal   Abdominal   Peds  Hematology   Anesthesia Other Findings   Reproductive/Obstetrics                             Anesthesia Physical Anesthesia Plan  ASA: 3  Anesthesia Plan: MAC   Post-op Pain Management:    Induction: Intravenous  PONV Risk Score and Plan: 1 and Propofol infusion and Treatment may vary due to age or medical condition  Airway Management Planned: Nasal Cannula  Additional Equipment:   Intra-op Plan:   Post-operative Plan:   Informed Consent: I have reviewed the patients History and Physical, chart, labs and discussed the procedure including the risks, benefits and alternatives for the proposed anesthesia with the patient or authorized representative who has indicated his/her understanding and acceptance.     Dental advisory given  Plan Discussed with: CRNA, Anesthesiologist and Surgeon  Anesthesia Plan Comments:        Anesthesia Quick Evaluation

## 2022-07-18 ENCOUNTER — Encounter (HOSPITAL_COMMUNITY): Payer: Self-pay | Admitting: Internal Medicine

## 2022-07-19 ENCOUNTER — Encounter: Payer: Self-pay | Admitting: Physician Assistant

## 2022-07-19 NOTE — H&P (Signed)
Cardiology Office Note:    Date:  07/06/2022   ID:  [name], DOB Dec 10, 1927, MRN [id number]  PCP:  Lorene Dy, [facility] Providers Cardiologist:  Sherren Mocha, MD Cardiology APP:  Sharmon Revere    Referring MD: Lorene Dy, MD   Chief Complaint:  MR Prior to Procedure. This is a repeat entry from 07/16/22.  History of Present Illness:   Mark Rivas is a 88 y.o. male .   Patient notes that he was doing well.   Had echo with moderate to severe MR There are no interval hospital/ED visit.    No chest pain or pressure .  No SOB/DOE and no PND/Orthopnea.  No weight gain or leg swelling.  No palpitations or syncope.  Stayed active.  Asymptomatic of A fib.  Was here due to his MR.  Had prior TEE with no issues.     Past Medical History:  Diagnosis Date   Allergic rhinitis    Borderline diabetes    Chronic atrial fibrillation ([place])    Zio monitor 10/21: AFib, avg HR 69, 2 pauses (likely during sleep) longest 3.2 sec; no high grade HB or pathologic pauses.   Dyslipidemia    Gout    History of anemia    History of echocardiogram    Echo 3/19: Mild LVH, EF 55-60, normal wall motion, mildly dilated aortic root (38 mm), MAC, trivial MR, moderate LAE, normal RV SF, mild RAE, PASP 39   Hypercholesterolemia    Macular degeneration    Mitral regurgitation    Echo 10/22: EF 55-60, no RWMA, moderate concentric LVH, mildly reduced RVSF, moderate LAE, mild RAE, moderate-severe MR, trivial AI, ascending aorta 40 mm // Echocardiogram 06/2022: EF 55-60, no RWMA, mod conc LVH, low normal RVSF, severe LAE, mild to mod RAE, mod to severe MR, trivial AI, AV sclerosis w/o AS, mild dilation of ascending aorta (43 mm)   PUD (peptic ulcer disease)    hx of pyloroplasty    Current Medications: Current Meds  Medication Sig   amLODipine (NORVASC) 5 MG tablet Take 5 mg by mouth daily.   apixaban (ELIQUIS) 5 MG TABS tablet TAKE 1 TABLET BY MOUTH TWICE A DAY    doxepin (SINEQUAN) 50 MG capsule Take 50 mg by mouth daily.   fexofenadine (ALLEGRA) 180 MG tablet Take 180 mg by mouth daily.   meclizine (ANTIVERT) 25 MG tablet Take 25 mg by mouth 3 (three) times daily as needed for dizziness (for dizziness).   metoprolol succinate (TOPROL-XL) 25 MG 24 hr tablet TAKE 1 TABLET BY MOUTH EVERY DAY   Omega-3 Fatty Acids (FISH OIL) 1000 MG CAPS Take 2,000 mg by mouth 2 (two) times daily.   simvastatin (ZOCOR) 20 MG tablet Take 20 mg by mouth at bedtime.    Allergies:   Patient has no known allergies.   Social History   Tobacco Use   Smoking status: Former    Types: Cigarettes    Quit date: 09/14/1975    Years since quitting: 46.8   Smokeless tobacco: Former  Scientific laboratory technician Use: Never used  Substance Use Topics   Alcohol use: Never   Drug use: Never    Family Hx: The patient's family history includes Heart attack in his mother; Heart disease in his father and sister.  Review of Systems  Constitutional: Negative for fever.  Gastrointestinal:  Negative for hematochezia.  Genitourinary:  Negative for hematuria.     EKGs/Labs/Other  Test Reviewed:     Recent Labs: 11/03/2021: ALT 14; BUN 18; Creatinine, Ser 1.04; Hemoglobin 13.7; Platelets 161; Potassium 4.4; Sodium 142   Recent Lipid Panel No results for input(s): "CHOL", "TRIG", "HDL", "VLDL", "LDLCALC", "LDLDIRECT" in the last 8760 hours.   Risk Assessment/Calculations/Metrics:    CHA2DS2-VASc Score = 4   This indicates a 4.8% annual risk of stroke. The patient's score is based upon: CHF History: 0 HTN History: 1 Diabetes History: 1 Stroke History: 0 Vascular Disease History: 0 Age Score: 2 Gender Score: 0             Physical Exam:    VS:  BP 120/60   Pulse 65   Ht _0  (1.88 m)   Wt 199 lb 9.6 oz (90.5 kg)   SpO2 96%   BMI 25.63 kg/m     Wt Readings from Last 3 Encounters:  07/06/22 199 lb 9.6 oz (90.5 kg)  11/03/21 195 lb (88.5 kg)  07/09/21 194 lb (88 kg)     Gen: no distress, elderly male Neck: No JVD Cardiac: No Rubs or Gallops, systolic  murmur, IRIR rhythm Respiratory: Clear to auscultation bilaterally,  normal effort, normal  respiratory rate GI: Soft, nontender, non-distended  MS: No  edema;  moves all extremities Integument: Skin feels warm Neuro:  At time of evaluation, alert and oriented to person/place/time/situation; hard of hearing Psych: Normal affect, patient feels ok      ASSESSMENT & PLAN:    Nonrheumatic MR Risks and benefits of transesophageal echocardiogram have been discussed with the patient.  These include bleeding, infection, tooth or esophagus damage, surgery or death.  The patient understands these risks and is willing to proceed.    Medication Adjustments/Labs and Tests Ordered: Current medicines are reviewed at length with the patient today.  Concerns regarding medicines are outlined above.  Tests Ordered: Orders Placed This Encounter  Procedures   Basic metabolic panel   CBC   EKG 12-Lead   Medication Changes: No orders of the defined types were placed in this encounter.  Rudean Haskell, MD [facility] [place],  [id number] [phone]  9:02 AM

## 2022-07-22 NOTE — Interval H&P Note (Signed)
History and Physical Interval Note:  07/22/2022 11:28 AM  Mark Rivas  has presented today for surgery, with the diagnosis of MITRAL REGURGITATION.  The various methods of treatment have been discussed with the patient and family. After consideration of risks, benefits and other options for treatment, the patient has consented to  Procedure(s): TRANSESOPHAGEAL ECHOCARDIOGRAM (TEE) (N/A) as a surgical intervention.  The patient's history has been reviewed, patient examined, no change in status, stable for surgery.  I have reviewed the patient's chart and labs.  Questions were answered to the patient's satisfaction.     Cyndee Giammarco A Jontay Maston

## 2022-07-26 NOTE — Anesthesia Postprocedure Evaluation (Signed)
Anesthesia Post Note  Patient: Mark Rivas  Procedure(s) Performed: TRANSESOPHAGEAL ECHOCARDIOGRAM (TEE)     Patient location during evaluation: Cath Lab Anesthesia Type: MAC Level of consciousness: awake and alert Pain management: pain level controlled Vital Signs Assessment: post-procedure vital signs reviewed and stable Respiratory status: spontaneous breathing, nonlabored ventilation, respiratory function stable and patient connected to nasal cannula oxygen Cardiovascular status: stable and blood pressure returned to baseline Postop Assessment: no apparent nausea or vomiting Anesthetic complications: no   No notable events documented.  Last Vitals:  Vitals:   07/16/22 1320 07/16/22 1330  BP: 119/81 131/81  Pulse: 76 80  Resp: 13 14  Temp:    SpO2: 96% 96%    Last Pain:  Vitals:   07/16/22 1330  TempSrc:   PainSc: 0-No pain                 Gwin Eagon S

## 2022-09-07 ENCOUNTER — Other Ambulatory Visit: Payer: Self-pay | Admitting: Cardiovascular Disease

## 2022-09-07 DIAGNOSIS — I4821 Permanent atrial fibrillation: Secondary | ICD-10-CM

## 2022-09-07 NOTE — Telephone Encounter (Signed)
Prescription refill request for Eliquis received. Indication:afib Last office visit:10/23 Scr:1.1 Age: 88 Weight:90.5  kg  Prescription refilled

## 2022-11-17 ENCOUNTER — Telehealth: Payer: Self-pay | Admitting: *Deleted

## 2022-11-17 NOTE — Telephone Encounter (Signed)
-----   Message from Liliane Shi, [place] sent at 11/17/2022 12:57 PM EST ----- He is supposed to have routine f/u in April or May. I don't see a recall. Can you get him set up? Thanks

## 2022-11-17 NOTE — Telephone Encounter (Signed)
Call placed to pt to schedule his 6 month follow up with Dr. Burt Knack or Richardson Dopp, PA-C for May.  Left a message for pt to call back.

## 2022-11-19 NOTE — Telephone Encounter (Signed)
Pt has been scheduled for 01/04/23 with Richardson Dopp, PA-C.

## 2023-01-04 ENCOUNTER — Encounter: Payer: Self-pay | Admitting: Physician Assistant

## 2023-01-04 ENCOUNTER — Ambulatory Visit: Payer: Medicare Other | Attending: Physician Assistant | Admitting: Physician Assistant

## 2023-01-04 VITALS — BP 138/80 | HR 86 | Ht 73.0 in | Wt 197.0 lb

## 2023-01-04 DIAGNOSIS — I4821 Permanent atrial fibrillation: Secondary | ICD-10-CM | POA: Diagnosis not present

## 2023-01-04 DIAGNOSIS — I10 Essential (primary) hypertension: Secondary | ICD-10-CM

## 2023-01-04 DIAGNOSIS — I77810 Thoracic aortic ectasia: Secondary | ICD-10-CM | POA: Diagnosis not present

## 2023-01-04 DIAGNOSIS — I340 Nonrheumatic mitral (valve) insufficiency: Secondary | ICD-10-CM

## 2023-01-04 NOTE — Assessment & Plan Note (Signed)
41 mm on TEE. Repeat Echocardiogram pending in 07/2023.

## 2023-01-04 NOTE — Progress Notes (Signed)
Cardiology Office Note:    Date:  01/04/2023  ID:  Mark Rivas, DOB 10-24-1927, MRN [id number] PCP: Burton Apley, MD  [facility] Providers Cardiologist:  Tonny Bollman, MD Cardiology APP:  Beatrice Lecher, PA-C       Patient Profile:   Atrial flutter S/p RFCA in 07/2009 Permanent atrial fibrillation Hx of multiple cardioversions in 2010 Coumadin Rx (followed by [facility]) Monitor 06/2020: AFib, Avg HR 69 Mitral regurgitation (functional MR w component of degen ? and MAC) Echo 10/22: Moderate to severe MR, EF 55-60 TTE 06/16/2022: Echocardiogram 06/2022: EF 55-60, no RWMA, mod conc LVH, low normal RVSF, severe LAE, mild to mod RAE, mod to severe MR, trivial AI, AV sclerosis w/o AS, mild dilation of ascending aorta (43 mm) TEE 07/16/22: Degen MV, mod MR functional related to atrial dilation, blunting of PV flow w/o reversal, EF 55-60, NL RVSF, severe LAE, mod RAE, mild AI, AV sclerosis, mild dilation of Asc aorta (41 mm) Orthostatic hypotension Amlodipine reduced in the past Sick sinus syndrome Borderline diabetes Hyperlipidemia BPPV Dilated ascending aorta Echo 10/22: 40 Echo 10/23: 43 mm // TEE 07/2022: 41 mm     History of Present Illness:   Mark Rivas is a 89 y.o. male returns for f/u of AFib, mitral regurgitation. He was last seen in 06/2022. TEE was set up to further evaluate his MR. This demonstrated mod MR. No further testing or intervention was warranted. He is here with his wife. He is doing well w/o chest pain, significant shortness of breath, orthopnea, leg edema. He did fall while crouched down to put the renewal sticker on his license tag on his car. He did not have a significant injury and did not hit his head.  Review of Systems  Musculoskeletal:  Positive for falls (yesterday).  Gastrointestinal:  Negative for hematochezia and melena.  Genitourinary:  Negative for hematuria.  See HPI     Studies Reviewed:    EKG:  not  done  Risk Assessment/Calculations:    CHA2DS2-VASc Score = 4   This indicates a 4.8% annual risk of stroke. The patient's score is based upon: CHF History: 0 HTN History: 1 Diabetes History: 1 Stroke History: 0 Vascular Disease History: 0 Age Score: 2 Gender Score: 0            Physical Exam:   VS:  BP 138/80   Pulse 86   Ht  (1.854 m)   Wt 197 lb (89.4 kg)   SpO2 98%   BMI 25.99 kg/m    Wt Readings from Last 3 Encounters:  01/04/23 197 lb (89.4 kg)  07/16/22 199 lb 9.6 oz (90.5 kg)  07/06/22 199 lb 9.6 oz (90.5 kg)    Constitutional:      Appearance: Healthy appearance. Not in distress.  Neck:     Vascular: JVD normal.  Pulmonary:     Breath sounds: Normal breath sounds. No wheezing. No rales.  Cardiovascular:     Normal rate. Irregularly irregular rhythm.     Murmurs: There is no murmur.  Edema:    Peripheral edema absent.  Abdominal:     Palpations: Abdomen is soft.      ASSESSMENT AND PLAN:   Nonrheumatic mitral valve regurgitation He remains asymptomatic. Mod MR by TEE in Nov 2023. Continue med Rx. Plan f/u echocardiogram in 07/2023 with f/u appt afterward.   Ascending aorta dilation 41 mm on TEE. Repeat Echocardiogram pending in 07/2023.   Permanent atrial  fibrillation (HCC) Rate is controlled. He is tolerating anticoagulation. He fell yesterday but did not suffer any significant injury. He will get labs with primary care next month for his annual physical. He has had a SCr < 1.5 and Wt > 60 kg. Continue Eliquis 5 mg twice daily, Toprol XL 25 mg once daily.    Essential hypertension BP readings at home are optimal. He has had issues with orthostatic hypotension in the past prompting reduction in Amlodipine dose. Continue Amlodipine 5 mg once daily, Toprol XL 25 mg once daily.        Dispo:  Return in about 7 months (around 08/06/2023) for Follow up after testing, w/ Tereso Newcomer, PA-C.  Signed, Tereso Newcomer, PA-C

## 2023-01-04 NOTE — Patient Instructions (Signed)
Medication Instructions:  Your physician recommends that you continue on your current medications as directed. Please refer to the Current Medication list given to you today.  *If you need a refill on your cardiac medications before your next appointment, please call your pharmacy*   Lab Work: None ordered  If you have labs (blood work) drawn today and your tests are completely normal, you will receive your results only by: MyChart Message (if you have MyChart) OR A paper copy in the mail If you have any lab test that is abnormal or we need to change your treatment, we will call you to review the results.   Testing/Procedures: Your physician has requested that you have an echocardiogram IN [place]. Echocardiography is a painless test that uses sound waves to create images of your heart. It provides your doctor with information about the size and shape of your heart and how well your heart's chambers and valves are working. This procedure takes approximately one hour. There are no restrictions for this procedure. Please do NOT wear cologne, perfume, aftershave, or lotions (deodorant is allowed). Please arrive 15 minutes prior to your appointment time.     Follow-Up: At [facility], you and your health needs are our priority.  As part of our continuing mission to provide you with exceptional heart care, we have created designated Provider Care Teams.  These Care Teams include your primary Cardiologist (physician) and Advanced Practice Providers (APPs -  Physician Assistants and Nurse Practitioners) who all work together to provide you with the care you need, when you need it.  We recommend signing up for the patient portal called "MyChart".  Sign up information is provided on this After Visit Summary.  MyChart is used to connect with patients for Virtual Visits (Telemedicine).  Patients are able to view lab/test results, encounter notes, upcoming appointments, etc.  Non-urgent  messages can be sent to your provider as well.   To learn more about what you can do with MyChart, go to [email or web address].    Your next appointment:   AFTER ECHO IN NOVEMBER  Provider:   Tereso Newcomer, PA-C         Other Instructions

## 2023-01-04 NOTE — Assessment & Plan Note (Signed)
He remains asymptomatic. Mod MR by TEE in Nov 2023. Continue med Rx. Plan f/u echocardiogram in 07/2023 with f/u appt afterward.

## 2023-01-04 NOTE — Assessment & Plan Note (Signed)
BP readings at home are optimal. He has had issues with orthostatic hypotension in the past prompting reduction in Amlodipine dose. Continue Amlodipine 5 mg once daily, Toprol XL 25 mg once daily.

## 2023-01-04 NOTE — Assessment & Plan Note (Signed)
Rate is controlled. He is tolerating anticoagulation. He fell yesterday but did not suffer any significant injury. He will get labs with primary care next month for his annual physical. He has had a SCr < 1.5 and Wt > 60 kg. Continue Eliquis 5 mg twice daily, Toprol XL 25 mg once daily.

## 2023-01-05 ENCOUNTER — Ambulatory Visit: Payer: Medicare Other | Admitting: Physician Assistant

## 2023-01-05 ENCOUNTER — Other Ambulatory Visit: Payer: Self-pay | Admitting: Cardiovascular Disease

## 2023-01-05 DIAGNOSIS — I4821 Permanent atrial fibrillation: Secondary | ICD-10-CM

## 2023-01-06 NOTE — Telephone Encounter (Signed)
Eliquis  refill request received. Patient is 89 years old, weight-89.4kg, Crea-1.16 on 07/06/22, Diagnosis-Afib, and last seen by Tereso Newcomer on 01/04/23. Dose is appropriate based on dosing criteria. Will send in refill to requested pharmacy.

## 2023-07-15 ENCOUNTER — Ambulatory Visit (HOSPITAL_COMMUNITY): Payer: Medicare Other | Attending: Physician Assistant

## 2023-07-15 DIAGNOSIS — I340 Nonrheumatic mitral (valve) insufficiency: Secondary | ICD-10-CM | POA: Diagnosis present

## 2023-07-15 LAB — ECHOCARDIOGRAM COMPLETE
Area-P 1/2: 3.44 cm2
Calc EF: 50.8 %
MV M vel: 4.93 m/s
MV Peak grad: 97 mm[Hg]
MV VTI: 4.12 cm2
P 1/2 time: 389 ms
Radius: 0.5 cm
S' Lateral: 2.4 cm
Single Plane A2C EF: 47.8 %
Single Plane A4C EF: 53.3 %

## 2023-07-18 ENCOUNTER — Telehealth: Payer: Self-pay | Admitting: *Deleted

## 2023-07-18 DIAGNOSIS — I340 Nonrheumatic mitral (valve) insufficiency: Secondary | ICD-10-CM

## 2023-07-18 DIAGNOSIS — I77810 Thoracic aortic ectasia: Secondary | ICD-10-CM

## 2023-07-18 NOTE — Telephone Encounter (Signed)
-----   Message from [place] sent at 07/17/2023  7:19 PM EST ----- Echocardiogram shows normal ejection fraction (heart function). Mitral valve leakage (mitral regurgitation) is moderate but stable. There has been no significant change. The aorta is mildly dilated. This is also stable.  PLAN:  - Continue current medications/treatment plan and follow up as scheduled.  - Repeat Echocardiogram in 1 year (mitral regurgitation, dilated ascending aorta) Tereso Newcomer, PA-C    07/17/2023 7:14 PM

## 2023-07-21 NOTE — Progress Notes (Signed)
Cardiology Office Note:    Date:  07/22/2023  ID:  Mark Rivas, DOB [birth year], MRN [id number] PCP: Burton Apley, MD  [facility] Providers Cardiologist:  Tonny Bollman, MD Cardiology APP:  Beatrice Lecher, PA-C       Patient Profile:      Atrial flutter S/p RFCA in 07/2009 Permanent atrial fibrillation Hx of multiple cardioversions in 2010 Previously on Coumadin Rx >> ? to Eliquis Monitor 06/2020: AFib, Avg HR 69 Mitral regurgitation (functional MR w component of degen ? and MAC) Echo 10/22: Moderate to severe MR, EF 55-60 TTE 06/16/2022: Echocardiogram 06/2022: EF 55-60, no RWMA, mod conc LVH, low normal RVSF, severe LAE, mild to mod RAE, mod to severe MR, trivial AI, AV sclerosis w/o AS, mild dilation of ascending aorta (43 mm) TEE 07/16/22: Degen MV, mod MR functional related to atrial dilation, blunting of PV flow w/o reversal, EF 55-60, NL RVSF, severe LAE, mod RAE, mild AI, AV sclerosis, mild dilation of Asc aorta (41 mm) TTE 07/15/23: EF 60-65, no RWMA, mild LVH. NL RVSF, severe LAE, mod MR, trivial AI, AV sclerosis, aortic root 43, ascending aorta 42 mm  Orthostatic hypotension Amlodipine reduced in the past Sick sinus syndrome Borderline diabetes Hyperlipidemia BPPV Dilated ascending aorta Echo 10/22: 40 Echo 10/23: 43 mm // TEE 07/2022: 41 mm  TTE 07/2023: 42 mm         History of Present Illness:  Discussed the use of AI scribe software for clinical note transcription with the patient, who gave verbal consent to proceed.  Mark Rivas is a 89 y.o. male who returns for follow-up of atrial fibrillation, mitral regurgitation.  He was last seen in April 2024.  Most recent echocardiogram demonstrated stable mitral regurgitation and stable dilation of ascending aorta. He is here with his son. He reports a gradual decrease in exercise tolerance, particularly noting difficulty with walking long distances. He denies any associated chest pain, dyspnea, or  orthopnea. He also denies any recent episodes of dizziness or imbalance, but expresses a need for caution due to a perceived unsteadiness. There is no history of recent falls. He has not had syncope.     ROS   See HPI     Studies Reviewed:                 Risk Assessment/Calculations:    CHA2DS2-VASc Score = 4   This indicates a 4.8% annual risk of stroke. The patient's score is based upon: CHF History: 0 HTN History: 1 Diabetes History: 1 Stroke History: 0 Vascular Disease History: 0 Age Score: 2 Gender Score: 0            Physical Exam:   VS:  BP 130/80   Pulse (!) 59   Ht 6\' 1"  (1.854 m)   Wt 199 lb 9.6 oz (90.5 kg)   SpO2 97%   BMI 26.33 kg/m    Wt Readings from Last 3 Encounters:  07/22/23 199 lb 9.6 oz (90.5 kg)  01/04/23 197 lb (89.4 kg)  07/16/22 199 lb 9.6 oz (90.5 kg)    Constitutional:      Appearance: Healthy appearance. Not in distress.  Neck:     Vascular: JVD normal.  Pulmonary:     Breath sounds: Normal breath sounds. No wheezing. No rales.  Cardiovascular:     Bradycardia present. Irregularly irregular rhythm.     Murmurs: There is no murmur.  Edema:    Peripheral edema absent.  Abdominal:  Palpations: Abdomen is soft.        Assessment and Plan:   Assessment & Plan Permanent atrial fibrillation (HCC) Rate is controlled. No recent falls or significant dizziness. Home blood pressure readings are normal. Current Eliquis dose is appropriate based on weight and kidney function.  - Continue Eliquis 5 mg twice daily.  - Request recent BMET, CBC from PCP - Schedule follow-up in one year  Nonrheumatic mitral valve regurgitation Moderate mitral valve regurgitation on recent echocardiogram. This is overall stable.  He is asymptomatic. - Continue annual echocardiogram to monitor mitral valve regurgitation Ascending aorta dilation (HCC) 42 mm on Echo Nov 2024.  - Continue annual echocardiogram to monitor aortic dilation  Essential  hypertension The patient's blood pressure is controlled on his current regimen.   -Continue amlodipine 5 mg daily, metoprolol succinate 25 mg daily.      Dispo:  Return in about 1 year (around 07/21/2024) for Routine Follow Up, w/ Tereso Newcomer, PA-C.  Signed, Tereso Newcomer, PA-C

## 2023-07-22 ENCOUNTER — Encounter: Payer: Self-pay | Admitting: Physician Assistant

## 2023-07-22 ENCOUNTER — Ambulatory Visit: Payer: Medicare Other | Attending: Physician Assistant | Admitting: Physician Assistant

## 2023-07-22 VITALS — BP 130/80 | HR 59 | Ht 73.0 in | Wt 199.6 lb

## 2023-07-22 DIAGNOSIS — I77810 Thoracic aortic ectasia: Secondary | ICD-10-CM | POA: Diagnosis not present

## 2023-07-22 DIAGNOSIS — I4821 Permanent atrial fibrillation: Secondary | ICD-10-CM

## 2023-07-22 DIAGNOSIS — I10 Essential (primary) hypertension: Secondary | ICD-10-CM | POA: Diagnosis not present

## 2023-07-22 DIAGNOSIS — I340 Nonrheumatic mitral (valve) insufficiency: Secondary | ICD-10-CM | POA: Diagnosis not present

## 2023-07-22 NOTE — Patient Instructions (Signed)
Medication Instructions:  Your physician recommends that you continue on your current medications as directed. Please refer to the Current Medication list given to you today.  *If you need a refill on your cardiac medications before your next appointment, please call your pharmacy*   Lab Work: None ordered  If you have labs (blood work) drawn today and your tests are completely normal, you will receive your results only by: [place] (if you have MyChart) OR A paper copy in the mail If you have any lab test that is abnormal or we need to change your treatment, we will call you to review the results.   Testing/Procedures: None ordered   Follow-Up: At [facility], you and your health needs are our priority.  As part of our continuing mission to provide you with exceptional heart care, we have created designated Provider Care Teams.  These Care Teams include your primary Cardiologist (physician) and Advanced Practice Providers (APPs -  Physician Assistants and Nurse Practitioners) who all work together to provide you with the care you need, when you need it.  We recommend signing up for the patient portal called "MyChart".  Sign up information is provided on this After Visit Summary.  MyChart is used to connect with patients for Virtual Visits (Telemedicine).  Patients are able to view lab/test results, encounter notes, upcoming appointments, etc.  Non-urgent messages can be sent to your provider as well.   To learn more about what you can do with MyChart, go to [email or web address].    Your next appointment:   12 month(s)  Provider:   Richardson Dopp, PA-C         Other Instructions

## 2023-07-22 NOTE — Assessment & Plan Note (Signed)
The patient's blood pressure is controlled on his current regimen.   -Continue amlodipine 5 mg daily, metoprolol succinate 25 mg daily.

## 2023-07-22 NOTE — Assessment & Plan Note (Signed)
Rate is controlled. No recent falls or significant dizziness. Home blood pressure readings are normal. Current Eliquis dose is appropriate based on weight and kidney function.  - Continue Eliquis 5 mg twice daily.  - Request recent BMET, CBC from PCP - Schedule follow-up in one year

## 2023-07-22 NOTE — Assessment & Plan Note (Signed)
Moderate mitral valve regurgitation on recent echocardiogram. This is overall stable.  He is asymptomatic. - Continue annual echocardiogram to monitor mitral valve regurgitation

## 2023-07-22 NOTE — Assessment & Plan Note (Signed)
42 mm on Echo Nov 2024.  - Continue annual echocardiogram to monitor aortic dilation

## 2023-08-23 ENCOUNTER — Other Ambulatory Visit: Payer: Self-pay | Admitting: Cardiovascular Disease

## 2023-08-23 DIAGNOSIS — I4821 Permanent atrial fibrillation: Secondary | ICD-10-CM

## 2023-08-23 NOTE — Telephone Encounter (Signed)
Prescription refill request for Eliquis received. Indication: AF Last office visit: 07/22/23  Wende Mott PA-C Scr: 1.16 on 07/06/22  Epic Age: 89 Weight: 90.5kg  Based on above findings Eliquis 5mg  twice daily is the appropriate dose.  Refill approved.

## 2023-09-13 IMAGING — DX DG CHEST 2V
2 series · 2 of 2 positions shown · non-contrast
Comparison: July 08, 2020

CLINICAL DATA: Chronic shortness of breath.

EXAM:
CHEST - 2 VIEW

[dg chest 2 view (1 of 2)]
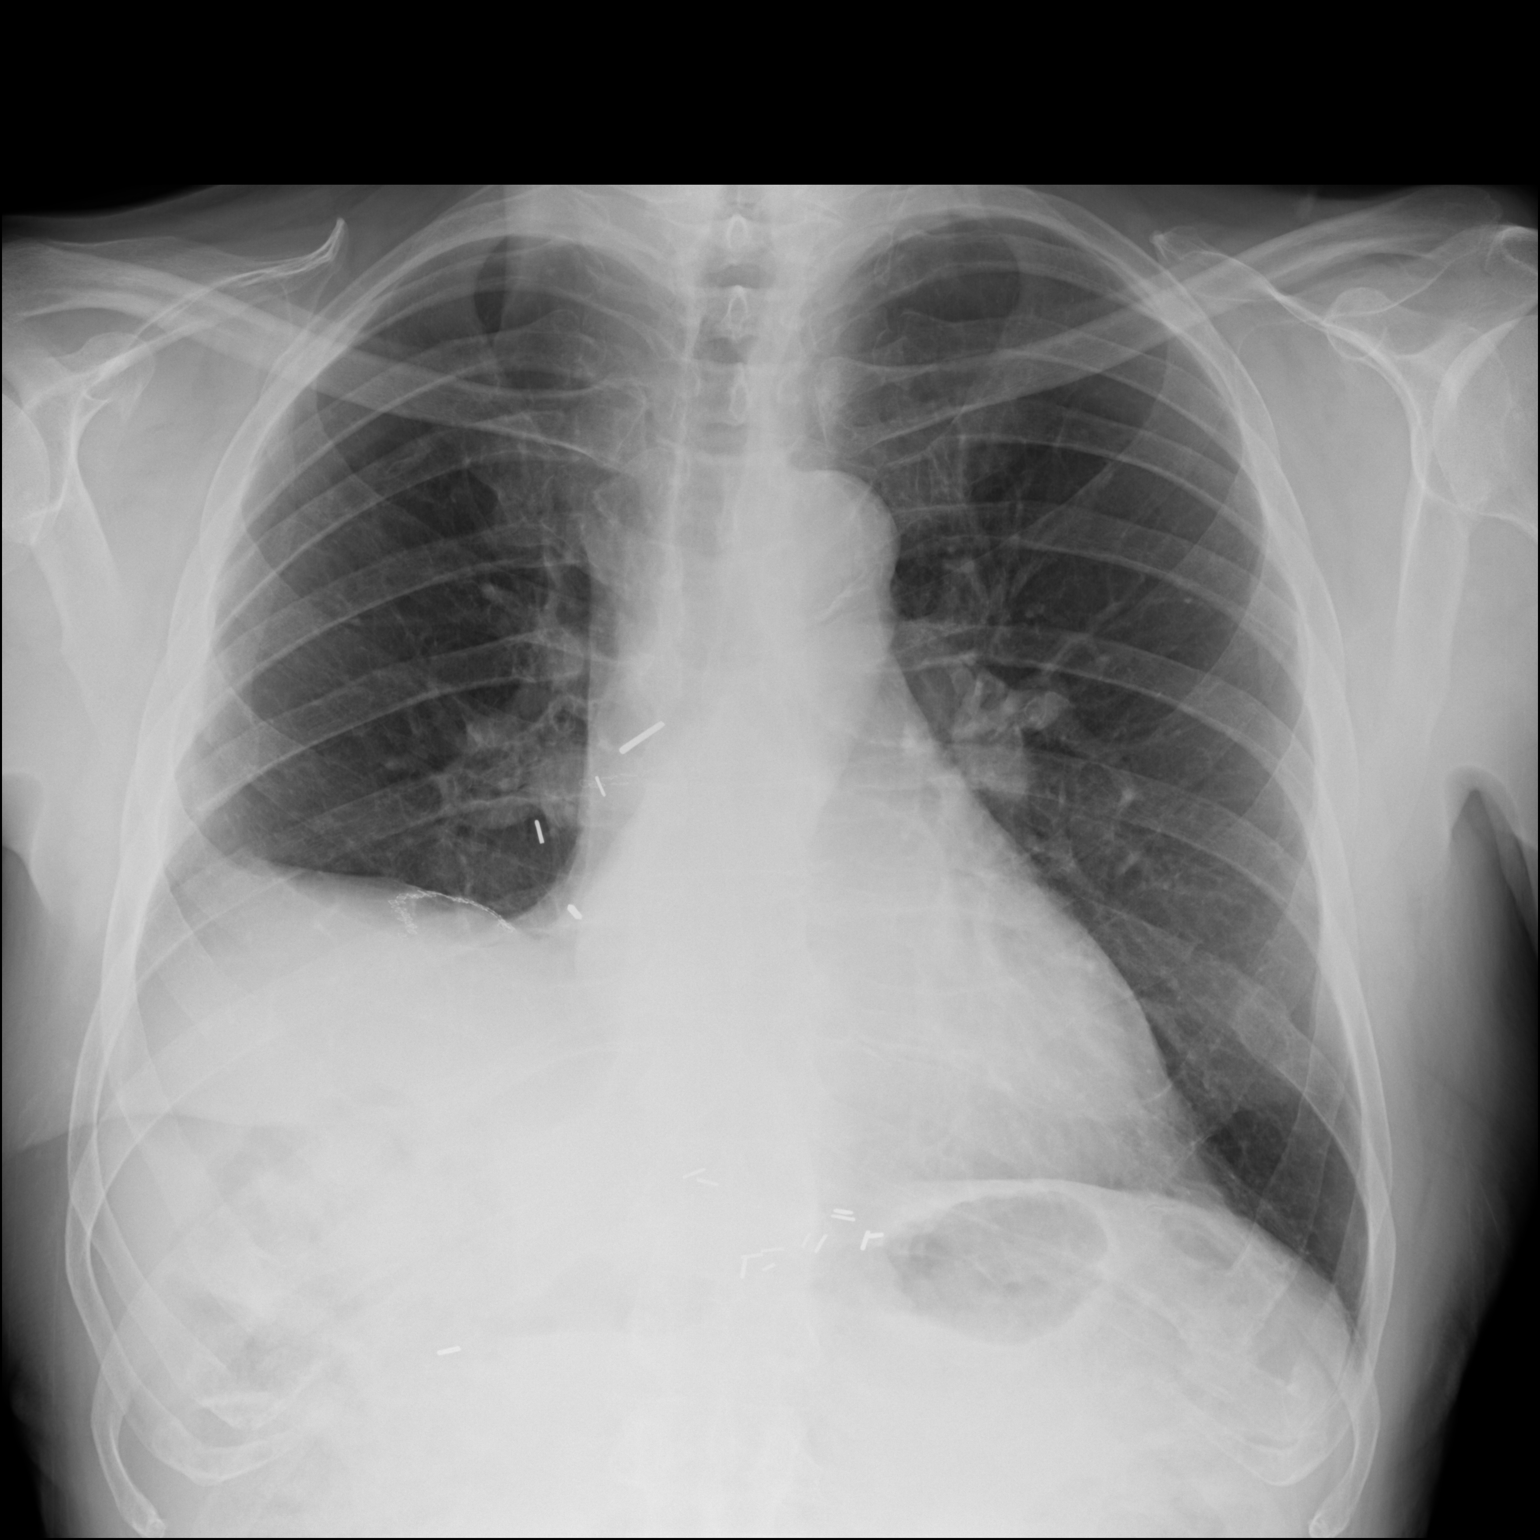

[dg chest 2 view (2 of 2)]
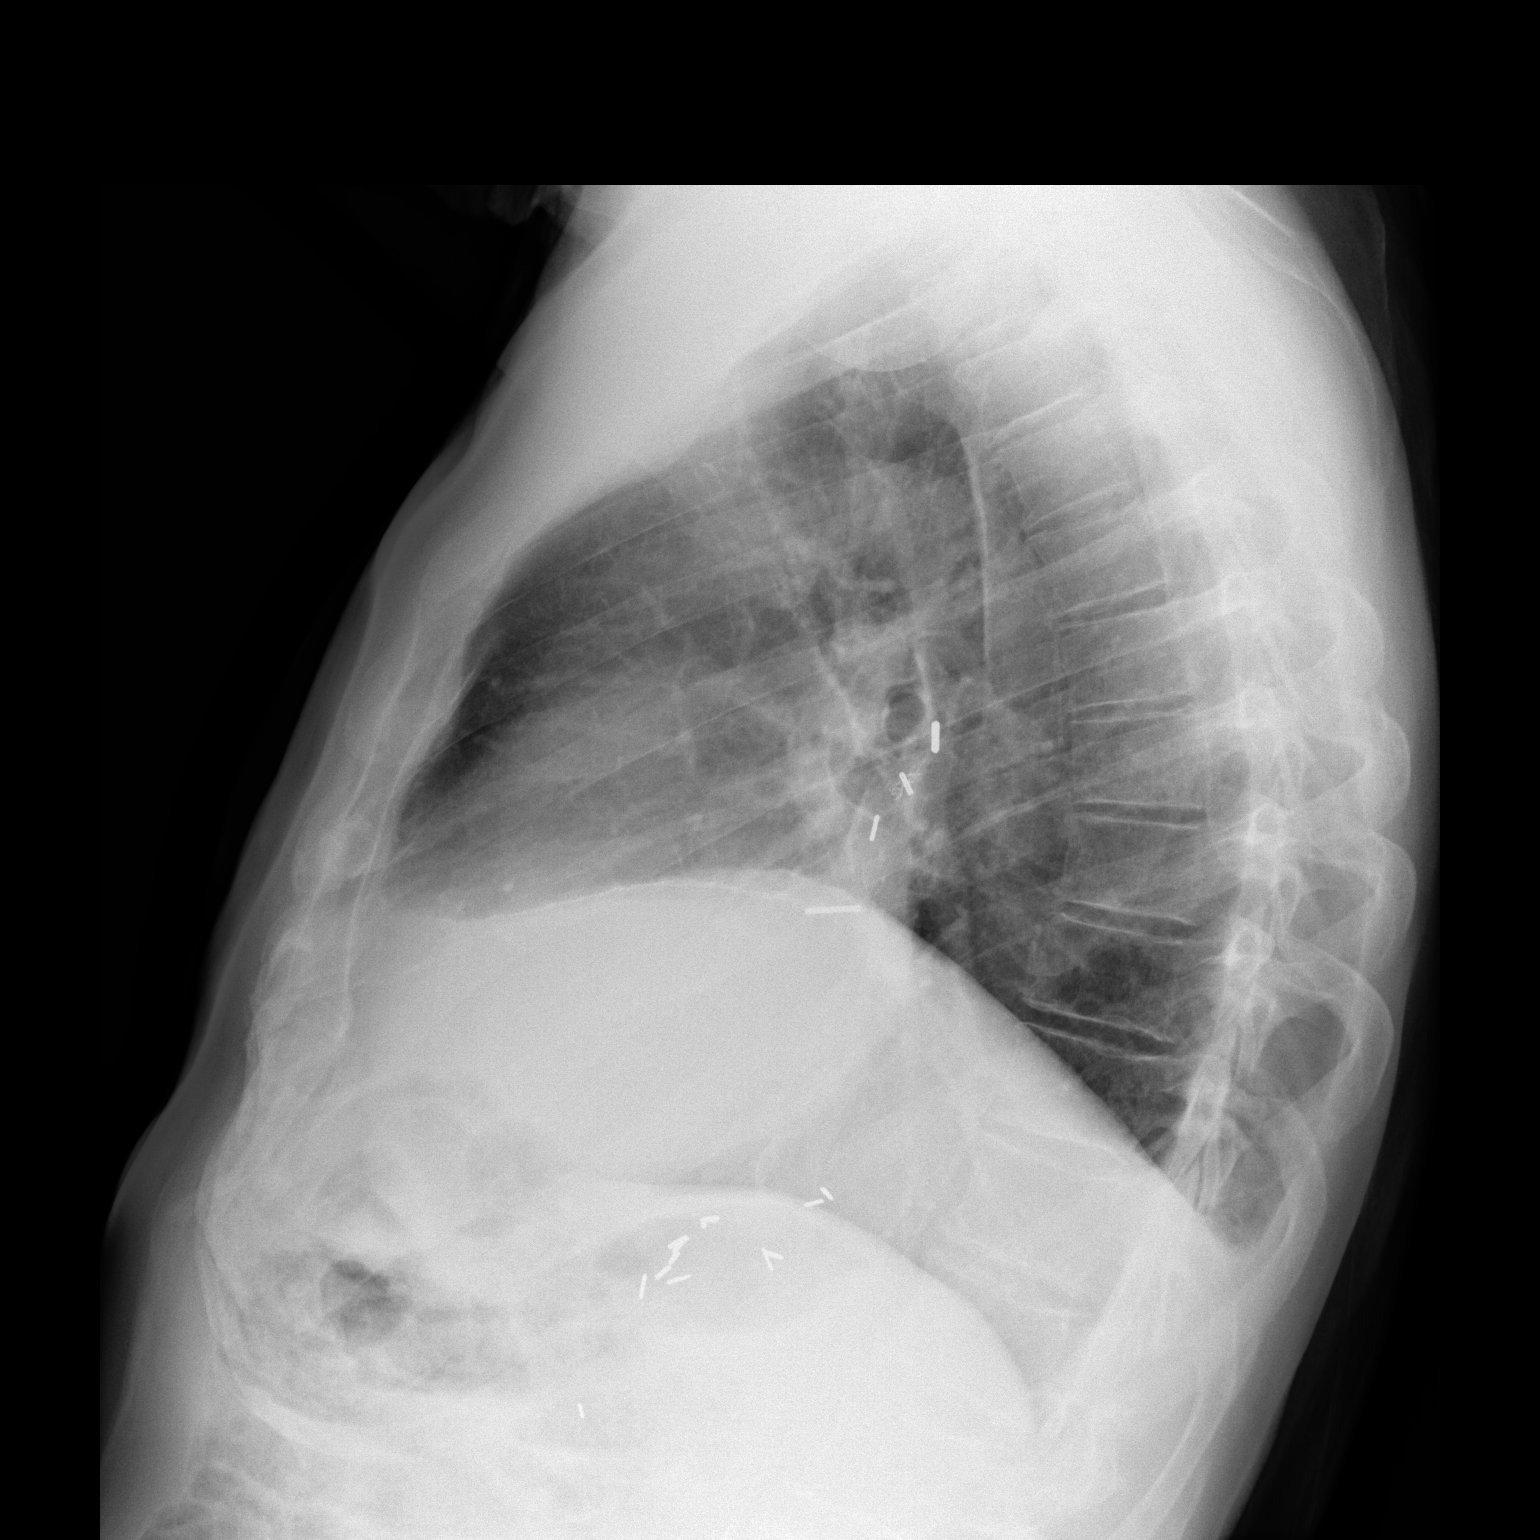

[2 of 2 positions shown; findings below may reference images not displayed]

FINDINGS: There is stable elevation of the right hemidiaphragm with surgical
sutures overlying the right lung base and surgical clips seen along
the right hilar region. The heart size and mediastinal contours are
within normal limits. Both lungs are otherwise clear. Radiopaque
surgical clips are seen within the right upper quadrant and along
the expected region of the gastroesophageal junction. The visualized
skeletal structures are unremarkable.
IMPRESSION: Stable postoperative changes, as described above, without acute or
active cardiopulmonary disease.

## 2024-04-25 ENCOUNTER — Other Ambulatory Visit: Payer: Self-pay | Admitting: Internal Medicine

## 2024-04-25 ENCOUNTER — Ambulatory Visit
Admission: RE | Admit: 2024-04-25 | Discharge: 2024-04-25 | Disposition: A | Discharge status: Home / Self Care | Source: Ambulatory Visit | Attending: Internal Medicine | Admitting: Internal Medicine

## 2024-04-25 DIAGNOSIS — J209 Acute bronchitis, unspecified: Secondary | ICD-10-CM

## 2024-06-12 ENCOUNTER — Other Ambulatory Visit: Payer: Self-pay | Admitting: *Deleted

## 2024-06-12 DIAGNOSIS — I77810 Thoracic aortic ectasia: Secondary | ICD-10-CM

## 2024-07-17 ENCOUNTER — Other Ambulatory Visit (HOSPITAL_COMMUNITY)

## 2024-07-18 ENCOUNTER — Ambulatory Visit: Payer: Self-pay | Admitting: Physician Assistant

## 2024-07-18 ENCOUNTER — Ambulatory Visit (HOSPITAL_COMMUNITY)
Admission: RE | Admit: 2024-07-18 | Discharge: 2024-07-18 | Disposition: A | Discharge status: Home / Self Care | Source: Ambulatory Visit | Attending: Cardiology | Admitting: Cardiology

## 2024-07-18 DIAGNOSIS — I77810 Thoracic aortic ectasia: Secondary | ICD-10-CM | POA: Diagnosis present

## 2024-07-18 DIAGNOSIS — I340 Nonrheumatic mitral (valve) insufficiency: Secondary | ICD-10-CM

## 2024-07-18 DIAGNOSIS — I4891 Unspecified atrial fibrillation: Secondary | ICD-10-CM | POA: Diagnosis not present

## 2024-07-18 DIAGNOSIS — E785 Hyperlipidemia, unspecified: Secondary | ICD-10-CM | POA: Insufficient documentation

## 2024-07-18 DIAGNOSIS — I080 Rheumatic disorders of both mitral and aortic valves: Secondary | ICD-10-CM | POA: Insufficient documentation

## 2024-07-18 LAB — ECHOCARDIOGRAM COMPLETE
MV M vel: 5.14 m/s
MV Peak grad: 105.8 mmHg
S' Lateral: 3.2 cm

## 2024-07-19 NOTE — Telephone Encounter (Signed)
Lvm of results and clinic number if any questions  

## 2024-07-19 NOTE — Telephone Encounter (Signed)
-----   Message from Glendia Ferrier sent at 07/18/2024 10:28 PM EST ----- Copy has been sent to PCP as FYI. Please call the patient. Echocardiogram shows normal ejection fraction (heart function). There is moderate mitral regurgitation (valve leakage) and mild aortic insufficiency (valve leakage). There is mild dilation of the  aorta. There has been no significant change since last echocardiogram in 07/2023. PLAN:  - Continue current medications - Plan repeat echocardiogram in 1 year to continue to monitor (order placed) Glendia Ferrier, PA-C    07/18/2024 10:21 PM   ----- Message ----- From: Interface, [place] Sent: 07/18/2024   3:44 PM EST To: Glendia ONEIDA Ferrier, PA-C
